# Patient Record
Sex: Male | Born: 1945 | Race: White | Hispanic: No | Smoking: Current every day smoker
Health system: Southern US, Community
[De-identification: ages and names within clinical notes are randomized; demographics above are authoritative.]

## PROBLEM LIST (undated history)

## (undated) DIAGNOSIS — C719 Malignant neoplasm of brain, unspecified: Secondary | ICD-10-CM

## (undated) DIAGNOSIS — I607 Nontraumatic subarachnoid hemorrhage from unspecified intracranial artery: Secondary | ICD-10-CM

## (undated) DIAGNOSIS — S065X9A Traumatic subdural hemorrhage with loss of consciousness of unspecified duration, initial encounter: Secondary | ICD-10-CM

## (undated) DIAGNOSIS — C7931 Secondary malignant neoplasm of brain: Secondary | ICD-10-CM

## (undated) DIAGNOSIS — M479 Spondylosis, unspecified: Secondary | ICD-10-CM

## (undated) DIAGNOSIS — E119 Type 2 diabetes mellitus without complications: Secondary | ICD-10-CM

## (undated) DIAGNOSIS — E7800 Pure hypercholesterolemia, unspecified: Secondary | ICD-10-CM

## (undated) DIAGNOSIS — T7840XA Allergy, unspecified, initial encounter: Secondary | ICD-10-CM

## (undated) DIAGNOSIS — Z9889 Other specified postprocedural states: Secondary | ICD-10-CM

## (undated) DIAGNOSIS — I671 Cerebral aneurysm, nonruptured: Secondary | ICD-10-CM

## (undated) DIAGNOSIS — J449 Chronic obstructive pulmonary disease, unspecified: Secondary | ICD-10-CM

## (undated) DIAGNOSIS — C3490 Malignant neoplasm of unspecified part of unspecified bronchus or lung: Secondary | ICD-10-CM

## (undated) DIAGNOSIS — M419 Scoliosis, unspecified: Secondary | ICD-10-CM

## (undated) DIAGNOSIS — I10 Essential (primary) hypertension: Secondary | ICD-10-CM

## (undated) DIAGNOSIS — S0300XA Dislocation of jaw, unspecified side, initial encounter: Secondary | ICD-10-CM

## (undated) DIAGNOSIS — S065XAA Traumatic subdural hemorrhage with loss of consciousness status unknown, initial encounter: Secondary | ICD-10-CM

## (undated) HISTORY — PX: HERNIA REPAIR: SHX51

## (undated) HISTORY — PX: MANDIBLE FRACTURE SURGERY: SHX706

## (undated) HISTORY — PX: HAND SURGERY: SHX662

## (undated) HISTORY — PX: APPENDECTOMY: SHX54

---

## 1998-04-28 HISTORY — PX: CRANIOTOMY: SHX93

## 1998-10-14 ENCOUNTER — Inpatient Hospital Stay (HOSPITAL_COMMUNITY): Admission: EM | Admit: 1998-10-14 | Discharge: 1998-10-18 | Payer: Self-pay | Admitting: Neurosurgery

## 1998-10-14 ENCOUNTER — Encounter: Payer: Self-pay | Admitting: Emergency Medicine

## 1998-10-23 ENCOUNTER — Ambulatory Visit (HOSPITAL_COMMUNITY): Admission: RE | Admit: 1998-10-23 | Discharge: 1998-10-23 | Payer: Self-pay | Admitting: Neurosurgery

## 2001-04-05 ENCOUNTER — Emergency Department (HOSPITAL_COMMUNITY): Admission: EM | Admit: 2001-04-05 | Discharge: 2001-04-05 | Payer: Self-pay | Admitting: Emergency Medicine

## 2001-04-05 ENCOUNTER — Encounter: Payer: Self-pay | Admitting: Emergency Medicine

## 2001-04-14 ENCOUNTER — Encounter: Admission: RE | Admit: 2001-04-14 | Discharge: 2001-04-14 | Payer: Self-pay | Admitting: Internal Medicine

## 2001-11-02 ENCOUNTER — Encounter: Admission: RE | Admit: 2001-11-02 | Discharge: 2001-11-02 | Payer: Self-pay | Admitting: Internal Medicine

## 2007-04-29 DIAGNOSIS — S0300XA Dislocation of jaw, unspecified side, initial encounter: Secondary | ICD-10-CM

## 2007-04-29 HISTORY — DX: Dislocation of jaw, unspecified side, initial encounter: S03.00XA

## 2007-06-14 ENCOUNTER — Emergency Department (HOSPITAL_COMMUNITY): Admission: EM | Admit: 2007-06-14 | Discharge: 2007-06-14 | Payer: Self-pay | Admitting: Emergency Medicine

## 2009-02-21 ENCOUNTER — Emergency Department (HOSPITAL_COMMUNITY): Admission: EM | Admit: 2009-02-21 | Discharge: 2009-02-21 | Payer: Self-pay | Admitting: Emergency Medicine

## 2010-04-28 DIAGNOSIS — I607 Nontraumatic subarachnoid hemorrhage from unspecified intracranial artery: Secondary | ICD-10-CM

## 2010-04-28 HISTORY — DX: Nontraumatic subarachnoid hemorrhage from unspecified intracranial artery: I60.7

## 2010-09-03 ENCOUNTER — Emergency Department (HOSPITAL_COMMUNITY)
Admission: EM | Admit: 2010-09-03 | Discharge: 2010-09-03 | Disposition: A | Discharge status: Nursing Home (Includes ICF) | Payer: Self-pay | Attending: Emergency Medicine | Admitting: Emergency Medicine

## 2010-09-03 ENCOUNTER — Emergency Department (HOSPITAL_COMMUNITY): Payer: Self-pay

## 2010-09-03 DIAGNOSIS — R61 Generalized hyperhidrosis: Secondary | ICD-10-CM | POA: Insufficient documentation

## 2010-09-03 DIAGNOSIS — H5500 Unspecified nystagmus: Secondary | ICD-10-CM | POA: Insufficient documentation

## 2010-09-03 DIAGNOSIS — R279 Unspecified lack of coordination: Secondary | ICD-10-CM | POA: Insufficient documentation

## 2010-09-03 DIAGNOSIS — R112 Nausea with vomiting, unspecified: Secondary | ICD-10-CM | POA: Insufficient documentation

## 2010-09-03 DIAGNOSIS — R51 Headache: Secondary | ICD-10-CM | POA: Insufficient documentation

## 2010-09-03 DIAGNOSIS — R42 Dizziness and giddiness: Secondary | ICD-10-CM | POA: Insufficient documentation

## 2010-09-03 DIAGNOSIS — I609 Nontraumatic subarachnoid hemorrhage, unspecified: Secondary | ICD-10-CM | POA: Insufficient documentation

## 2010-09-03 LAB — POCT I-STAT, CHEM 8
BUN: 14 mg/dL (ref 6–23)
Calcium, Ion: 1.08 mmol/L — ABNORMAL LOW (ref 1.12–1.32)
Chloride: 103 mEq/L (ref 96–112)
Creatinine, Ser: 1.1 mg/dL (ref 0.4–1.5)
Glucose, Bld: 153 mg/dL — ABNORMAL HIGH (ref 70–99)
HCT: 47 % (ref 39.0–52.0)
Hemoglobin: 16 g/dL (ref 13.0–17.0)
Potassium: 4.5 mEq/L (ref 3.5–5.1)
Sodium: 137 mEq/L (ref 135–145)
TCO2: 27 mmol/L (ref 0–100)

## 2010-09-03 LAB — DIFFERENTIAL
Basophils Absolute: 0 10*3/uL (ref 0.0–0.1)
Basophils Relative: 0 % (ref 0–1)
Eosinophils Absolute: 0 10*3/uL (ref 0.0–0.7)
Eosinophils Relative: 0 % (ref 0–5)
Lymphocytes Relative: 9 % — ABNORMAL LOW (ref 12–46)
Lymphs Abs: 1.1 10*3/uL (ref 0.7–4.0)
Monocytes Absolute: 0.5 10*3/uL (ref 0.1–1.0)
Monocytes Relative: 4 % (ref 3–12)
Neutro Abs: 10.4 10*3/uL — ABNORMAL HIGH (ref 1.7–7.7)
Neutrophils Relative %: 86 % — ABNORMAL HIGH (ref 43–77)

## 2010-09-03 LAB — CBC
HCT: 46 % (ref 39.0–52.0)
Hemoglobin: 15.8 g/dL (ref 13.0–17.0)
MCHC: 34.3 g/dL (ref 30.0–36.0)
MCV: 89.3 fL (ref 78.0–100.0)
RDW: 13.4 % (ref 11.5–15.5)

## 2010-09-03 LAB — APTT: aPTT: 25 seconds (ref 24–37)

## 2010-09-03 LAB — POCT CARDIAC MARKERS
CKMB, poc: 1.1 ng/mL (ref 1.0–8.0)
Myoglobin, poc: 88.3 ng/mL (ref 12–200)
Troponin i, poc: <0.05 ng/mL (ref 0.00–0.09)

## 2010-09-03 LAB — PROTIME-INR
INR: 0.94 (ref 0.00–1.49)
Prothrombin Time: 12.8 seconds (ref 11.6–15.2)

## 2010-10-14 ENCOUNTER — Emergency Department (HOSPITAL_BASED_OUTPATIENT_CLINIC_OR_DEPARTMENT_OTHER)
Admission: EM | Admit: 2010-10-14 | Discharge: 2010-10-14 | Disposition: A | Discharge status: Home / Self Care | Payer: Self-pay | Attending: Emergency Medicine | Admitting: Emergency Medicine

## 2010-10-14 DIAGNOSIS — F17200 Nicotine dependence, unspecified, uncomplicated: Secondary | ICD-10-CM | POA: Insufficient documentation

## 2010-10-14 DIAGNOSIS — I10 Essential (primary) hypertension: Secondary | ICD-10-CM | POA: Insufficient documentation

## 2013-02-03 ENCOUNTER — Encounter (HOSPITAL_BASED_OUTPATIENT_CLINIC_OR_DEPARTMENT_OTHER): Payer: Self-pay | Admitting: Emergency Medicine

## 2013-02-03 ENCOUNTER — Emergency Department (HOSPITAL_BASED_OUTPATIENT_CLINIC_OR_DEPARTMENT_OTHER)
Admission: EM | Admit: 2013-02-03 | Discharge: 2013-02-03 | Disposition: A | Discharge status: Home / Self Care | Payer: Non-veteran care | Attending: Emergency Medicine | Admitting: Emergency Medicine

## 2013-02-03 ENCOUNTER — Emergency Department (HOSPITAL_BASED_OUTPATIENT_CLINIC_OR_DEPARTMENT_OTHER): Payer: Non-veteran care

## 2013-02-03 DIAGNOSIS — R51 Headache: Secondary | ICD-10-CM | POA: Insufficient documentation

## 2013-02-03 DIAGNOSIS — S139XXA Sprain of joints and ligaments of unspecified parts of neck, initial encounter: Secondary | ICD-10-CM | POA: Insufficient documentation

## 2013-02-03 DIAGNOSIS — Y929 Unspecified place or not applicable: Secondary | ICD-10-CM | POA: Insufficient documentation

## 2013-02-03 DIAGNOSIS — R42 Dizziness and giddiness: Secondary | ICD-10-CM | POA: Insufficient documentation

## 2013-02-03 DIAGNOSIS — I10 Essential (primary) hypertension: Secondary | ICD-10-CM | POA: Insufficient documentation

## 2013-02-03 DIAGNOSIS — C3490 Malignant neoplasm of unspecified part of unspecified bronchus or lung: Secondary | ICD-10-CM

## 2013-02-03 DIAGNOSIS — X58XXXA Exposure to other specified factors, initial encounter: Secondary | ICD-10-CM | POA: Insufficient documentation

## 2013-02-03 DIAGNOSIS — Y939 Activity, unspecified: Secondary | ICD-10-CM | POA: Insufficient documentation

## 2013-02-03 DIAGNOSIS — S161XXA Strain of muscle, fascia and tendon at neck level, initial encounter: Secondary | ICD-10-CM

## 2013-02-03 DIAGNOSIS — Z8679 Personal history of other diseases of the circulatory system: Secondary | ICD-10-CM | POA: Insufficient documentation

## 2013-02-03 DIAGNOSIS — E7800 Pure hypercholesterolemia, unspecified: Secondary | ICD-10-CM | POA: Insufficient documentation

## 2013-02-03 DIAGNOSIS — Z79899 Other long term (current) drug therapy: Secondary | ICD-10-CM | POA: Insufficient documentation

## 2013-02-03 DIAGNOSIS — Z880 Allergy status to penicillin: Secondary | ICD-10-CM | POA: Insufficient documentation

## 2013-02-03 DIAGNOSIS — Z87828 Personal history of other (healed) physical injury and trauma: Secondary | ICD-10-CM | POA: Insufficient documentation

## 2013-02-03 HISTORY — DX: Essential (primary) hypertension: I10

## 2013-02-03 HISTORY — DX: Pure hypercholesterolemia, unspecified: E78.00

## 2013-02-03 HISTORY — DX: Cerebral aneurysm, nonruptured: I67.1

## 2013-02-03 HISTORY — DX: Malignant neoplasm of unspecified part of unspecified bronchus or lung: C34.90

## 2013-02-03 LAB — CBC WITH DIFFERENTIAL/PLATELET
Basophils Relative: 1 % (ref 0–1)
Eosinophils Absolute: 0.4 10*3/uL (ref 0.0–0.7)
Hemoglobin: 13.6 g/dL (ref 13.0–17.0)
Lymphs Abs: 2.6 10*3/uL (ref 0.7–4.0)
MCH: 30.5 pg (ref 26.0–34.0)
MCHC: 34.5 g/dL (ref 30.0–36.0)
Monocytes Relative: 12 % (ref 3–12)
Neutro Abs: 3.5 10*3/uL (ref 1.7–7.7)
Neutrophils Relative %: 48 % (ref 43–77)
Platelets: 196 10*3/uL (ref 150–400)
RBC: 4.46 MIL/uL (ref 4.22–5.81)

## 2013-02-03 LAB — BASIC METABOLIC PANEL
BUN: 19 mg/dL (ref 6–23)
Chloride: 99 mEq/L (ref 96–112)
GFR calc Af Amer: >90 mL/min (ref >90)
GFR calc non Af Amer: 87 mL/min — ABNORMAL LOW (ref >90)
Potassium: 3.9 mEq/L (ref 3.5–5.1)
Sodium: 136 mEq/L (ref 135–145)

## 2013-02-03 MED ORDER — HYDROCODONE-ACETAMINOPHEN 5-325 MG PO TABS: 1.0000 | ORAL_TABLET | Freq: Four times a day (QID) | ORAL | Status: DC | PRN

## 2013-02-03 MED ORDER — HYDROCODONE-ACETAMINOPHEN 5-325 MG PO TABS
2.0000 | ORAL_TABLET | Freq: Once | ORAL | Status: AC
  Administered 2013-02-03: 2 via ORAL
  Filled 2013-02-03: qty 2

## 2013-02-03 MED ORDER — IOHEXOL 350 MG/ML SOLN
100.0000 mL | Freq: Once | INTRAVENOUS | Status: AC | PRN
  Administered 2013-02-03: 100 mL via INTRAVENOUS

## 2013-02-03 MED ORDER — CYCLOBENZAPRINE HCL 10 MG PO TABS: 10.0000 mg | ORAL_TABLET | Freq: Two times a day (BID) | ORAL | Status: DC | PRN

## 2013-02-03 NOTE — ED Provider Notes (Signed)
CSN: [id number]     Arrival date & time 02/03/13  1406 History   First MD Initiated Contact with Patient 02/03/13 1500     Chief Complaint  Patient presents with  . Headache   (Consider location/radiation/quality/duration/timing/severity/associated sxs/prior Treatment) HPI Comments: 2 weeks of R sided neck pain at muscular insertion of occiput. Woke up w/ pain which has persisted.  Worse w/ neck flexion/rotation & palpation.  No alleviating symptoms. Pain constant.  No known injury. Described as a "crick in my neck".  Symptoms not similar to prior SAH due to cerebral aneurysm.   Patient is a 67 y.o. male presenting with headaches. The history is provided by the patient and a relative. No language interpreter was used.  Headache Pain location:  Occipital and frontal (about 1.5 weeks) Quality:  Sharp (like brain freeze) Radiates to:  Does not radiate Pain severity now: moderate. Pain scale at highest: moderate. Onset quality:  Sudden Duration: 1-10 mins. Timing:  Intermittent Progression:  Waxing and waning Chronicity:  New Similar to prior headaches: no   Relieved by:  Nothing Worsened by:  Nothing tried Ineffective treatments:  None tried Associated symptoms: dizziness (chronic, unchanged for 2 years), loss of balance (chronic, unchanged for 2 years since [facility]) and neck pain   Associated symptoms: no abdominal pain, no back pain, no blurred vision, no congestion, no cough, no diarrhea, no pain, no fatigue, no fever, no nausea, no numbness, no paresthesias, no photophobia, no URI, no vomiting and no weakness   Risk factors comment:  Hx SAH   Past Medical History  Diagnosis Date  . Hypertension   . High cholesterol   . Cerebral aneurysm    History reviewed. No pertinent past surgical history. No family history on file. History  Substance Use Topics  . Smoking status: Not on file  . Smokeless tobacco: Not on file  . Alcohol Use: No    Review of Systems  Constitutional:  Negative for fever, activity change, appetite change and fatigue.  HENT: Negative for congestion, facial swelling, rhinorrhea and trouble swallowing.   Eyes: Negative for blurred vision, photophobia and pain.  Respiratory: Negative for cough, chest tightness and shortness of breath.   Cardiovascular: Negative for chest pain and leg swelling.  Gastrointestinal: Negative for nausea, vomiting, abdominal pain, diarrhea and constipation.  Endocrine: Negative for polydipsia and polyuria.  Genitourinary: Negative for dysuria, urgency, decreased urine volume and difficulty urinating.  Musculoskeletal: Positive for neck pain. Negative for back pain and gait problem.  Skin: Negative for color change, rash and wound.  Allergic/Immunologic: Negative for immunocompromised state.  Neurological: Positive for dizziness (chronic, unchanged for 2 years), headaches and loss of balance (chronic, unchanged for 2 years since [facility]). Negative for facial asymmetry, speech difficulty, weakness, numbness and paresthesias.  Psychiatric/Behavioral: Negative for confusion, decreased concentration and agitation.    Allergies  Penicillins  Home Medications   Current Outpatient Rx  Name  Route  Sig  Dispense  Refill  . Cholecalciferol (VITAMIN D PO)   Oral   Take by mouth.         Marland Kitchen HYDROCHLOROTHIAZIDE PO   Oral   Take by mouth.         Marland Kitchen LISINOPRIL PO   Oral   Take by mouth.         . cyclobenzaprine (FLEXERIL) 10 MG tablet   Oral   Take 1 tablet (10 mg total) by mouth 2 (two) times daily as needed for muscle spasms.   15 tablet  0   . HYDROcodone-acetaminophen (NORCO) 5-325 MG per tablet   Oral   Take 1 tablet by mouth every 6 (six) hours as needed for pain.   10 tablet   0    BP 131/58  Pulse 60  Temp(Src) 98.3 F (36.8 C) (Oral)  Resp 18  SpO2 100% Physical Exam  Constitutional: He is oriented to person, place, and time. He appears well-developed and well-nourished. No distress.    HENT:  Head: Normocephalic and atraumatic.  Mouth/Throat: No oropharyngeal exudate.  Eyes: Pupils are equal, round, and reactive to light.  Neck: Normal range of motion. Neck supple. Muscular tenderness present. No spinous process tenderness present. No edema, no erythema and normal range of motion present.    Cardiovascular: Normal rate, regular rhythm and normal heart sounds.  Exam reveals no gallop and no friction rub.   No murmur heard. Pulmonary/Chest: Effort normal and breath sounds normal. No respiratory distress. He has no wheezes. He has no rales.  Abdominal: Soft. Bowel sounds are normal. He exhibits no distension and no mass. There is no tenderness. There is no rebound and no guarding.  Musculoskeletal: Normal range of motion. He exhibits no edema and no tenderness.  Neurological: He is alert and oriented to person, place, and time.  Skin: Skin is warm and dry.  Psychiatric: He has a normal mood and affect.    ED Course  Procedures (including critical care time) Labs Review Labs Reviewed  BASIC METABOLIC PANEL - Abnormal; Notable for the following:    Glucose, Bld 131 (*)    GFR calc non Af Amer 87 (*)    All other components within normal limits  CBC WITH DIFFERENTIAL   Imaging Review Ct Angio Head W/cm &/or Wo Cm  02/03/2013   CLINICAL DATA:  Headaches.  History of subarachnoid hemorrhage.  EXAM: CT ANGIOGRAPHY HEAD AND NECK  TECHNIQUE: Multidetector CT imaging of the head and neck was performed using the standard protocol during bolus administration of intravenous contrast. Multiplanar CT image reconstructions including MIPs were obtained to evaluate the vascular anatomy. Carotid stenosis measurements (when applicable) are obtained utilizing NASCET criteria, using the distal internal carotid diameter as the denominator.  CONTRAST:  OMNIPAQUE IOHEXOL 350 MG/ML SOLN  COMPARISON:  CT head 09/03/2010.  FINDINGS: CTA HEAD FINDINGS  Mild atrophy. Chronic microvascular  ischemic change. No acute stroke, hemorrhage, mass lesion, hydrocephalus, or extra-axial fluid. Prior left frontotemporal craniotomy for subdural. Chronic left zygoma deformity. No abnormal enhancement postcontrast. Small burr hole on the right, likely secondary to intracranial pressure monitor.  Non stenotic atheromatous change both carotid siphons. Normal basilar artery with vertebrals both contributing, left slightly larger.  No proximal stenosis of the anterior, middle, or posterior cerebral arteries. No cerebellar branch occlusion.  Within limits of detection on CTA, no visible intracranial aneurysm. No intracranial branch occlusion.  Review of the MIP images confirms the above findings.  CTA NECK FINDINGS  Transverse arch atheromatous change. Moderate plaque left subclavian origin is not flow reducing. Mild plaque origin of the right and left common carotid arteries.  Non stenotic calcific and soft plaque at the right carotid bifurcation. Less than 50% stenosis left carotid bifurcation with both calcific and soft plaque, measuring 3.2/4.7 proximal/ distal luminal diameters). Both vertebrals are widely patent without significant ostial stenosis.  COPD changes at the lung apices without pneumothorax. No neck masses. Airway midline. Cervical spondylosis.  Review of the MIP images confirms the above findings.  IMPRESSION: CTA HEAD IMPRESSION  Chronic  changes as described. Left craniotomy. No acute subarachnoid hemorrhage. No visible intracranial dissection or aneurysm.  CTA NECK IMPRESSION  Non stenotic atheromatous change both carotid bifurcations, left greater than right. No evidence for dissection or vascular occlusion. COPD.   Electronically Signed   By: Davonna Belling M.D.   On: 02/03/2013 17:25   Ct Angio Neck W/cm &/or Wo/cm  02/03/2013   CLINICAL DATA:  Headaches.  History of subarachnoid hemorrhage.  EXAM: CT ANGIOGRAPHY HEAD AND NECK  TECHNIQUE: Multidetector CT imaging of the head and neck was  performed using the standard protocol during bolus administration of intravenous contrast. Multiplanar CT image reconstructions including MIPs were obtained to evaluate the vascular anatomy. Carotid stenosis measurements (when applicable) are obtained utilizing NASCET criteria, using the distal internal carotid diameter as the denominator.  CONTRAST:  OMNIPAQUE IOHEXOL 350 MG/ML SOLN  COMPARISON:  CT head 09/03/2010.  FINDINGS: CTA HEAD FINDINGS  Mild atrophy. Chronic microvascular ischemic change. No acute stroke, hemorrhage, mass lesion, hydrocephalus, or extra-axial fluid. Prior left frontotemporal craniotomy for subdural. Chronic left zygoma deformity. No abnormal enhancement postcontrast. Small burr hole on the right, likely secondary to intracranial pressure monitor.  Non stenotic atheromatous change both carotid siphons. Normal basilar artery with vertebrals both contributing, left slightly larger.  No proximal stenosis of the anterior, middle, or posterior cerebral arteries. No cerebellar branch occlusion.  Within limits of detection on CTA, no visible intracranial aneurysm. No intracranial branch occlusion.  Review of the MIP images confirms the above findings.  CTA NECK FINDINGS  Transverse arch atheromatous change. Moderate plaque left subclavian origin is not flow reducing. Mild plaque origin of the right and left common carotid arteries.  Non stenotic calcific and soft plaque at the right carotid bifurcation. Less than 50% stenosis left carotid bifurcation with both calcific and soft plaque, measuring 3.2/4.7 proximal/ distal luminal diameters). Both vertebrals are widely patent without significant ostial stenosis.  COPD changes at the lung apices without pneumothorax. No neck masses. Airway midline. Cervical spondylosis.  Review of the MIP images confirms the above findings.  IMPRESSION: CTA HEAD IMPRESSION  Chronic changes as described. Left craniotomy. No acute subarachnoid hemorrhage. No  visible intracranial dissection or aneurysm.  CTA NECK IMPRESSION  Non stenotic atheromatous change both carotid bifurcations, left greater than right. No evidence for dissection or vascular occlusion. COPD.   Electronically Signed   By: Davonna Belling M.D.   On: 02/03/2013 17:25    EKG Interpretation   None       MDM   1. Neck strain, initial encounter   2. Headache    Pt is a 67 y.o. male with Pmhx as above including traumatic SDH and spontaneous cerebral aneurysm rupture about 2 years ago who presents with 2 weeks of R sided neck pain at muscular insertion at occiput, worse w/ mvmt & palpation, as well as about 1.5 weeks of intermittent assoc h/a lasting 1-10 mins happening 4-5 times daily, described as like a "brain freeze".  No other assoc symptoms, no new neuro symptoms. Pt in NAD, well-appearing on exam. Has slight ataxia which pt & relative say are baseline. +ttp paraspinal musculature as above. CTA head/neck are without acute changes.  I doubt CVA, TIA, dissection, SAH and feel this is more likely tension h/a or neck strain.  Pt felt improved after 2 norco's.  Will d/c home w/ flexeril, norco and recommend continued ibuprofen for pain.  Return precautions given for new or worsening symptoms including new neuro complaints, fever,  confusion. Pt will f/u with PCP at the [facility], MD 02/04/13 1624

## 2013-02-03 NOTE — ED Notes (Signed)
Patient asked to change into gown. 

## 2013-02-03 NOTE — ED Notes (Addendum)
Headache. Hx cerebral aneursym in the past and he has headaches off and on. His balance has been off.

## 2014-11-20 ENCOUNTER — Other Ambulatory Visit (HOSPITAL_COMMUNITY): Payer: Self-pay | Admitting: Internal Medicine

## 2014-11-20 DIAGNOSIS — C3490 Malignant neoplasm of unspecified part of unspecified bronchus or lung: Secondary | ICD-10-CM

## 2014-11-23 ENCOUNTER — Encounter: Payer: Self-pay | Admitting: Radiation Oncology

## 2014-11-23 NOTE — Progress Notes (Signed)
Location/Histology of Brain Tumor:  11/15/14 CT HEAD multiple (at least 15) enhancing nodules scattered throughout the B/L cerebral hemispheres and posterior fossa consistent with intracranial metastases  Patient presented with symptoms of:  Patient had pain in both sides under his ribs.  He has also lost 25 lbs since June.  He had dizziness and coughing.    Past or anticipated interventions, if any, per neurosurgery: HX  Post op changes from right frontal burr hole and left frontal craniotomy  Past or anticipated interventions, if any, per medical oncology: NO  Dose of Decadron, if applicable: takes 4 mg three times a day.  Recent neurologic symptoms, if any:   Seizures: had 1 in 2006  Headaches: Chronic for 2 years  Dizziness/ataxia: chronic 2 years ,loss balance, uses a cane  Difficulty with hand coordination: no  Focal numbness/weakness: off balance   Visual deficits/changes: reports that his eyes try to cross about twice a week when he wakes up at 2-3 in the morning  Confusion/Memory deficits: no  Painful bone metastases at present, if any: no.  PET scan scheduled for Monday.  SAFETY ISSUES:  Prior radiation? NO  Pacemaker/ICD? NO  Is the patient on meth otrexate? NO  Additional Complaints / other details: NON Small Cell Carcinoma,   from [facility], referral by Dr. Judithann Graves  ,lives alone, Daughter [name] ,[place] cerbral aneurysm 2012,  hx left  Frontal craniotomy COPD  Numerous hepatic lesions  Retrocrural left node suspicious for metastatic disease, B/L inguinal hernias Allergies: PCNS  Patient is here with his daughter and granddaughter.  BP 152/84 mmHg  Pulse 88  Temp(Src) 98.4 F (36.9 C) (Oral)  Resp 20  Ht 5' 11.5" (1.816 m)  Wt 175 lb 4.8 oz (79.516 kg)  BMI 24.11 kg/m2  SpO2 99%

## 2014-11-27 ENCOUNTER — Ambulatory Visit
Admission: RE | Admit: 2014-11-27 | Discharge: 2014-11-27 | Disposition: A | Discharge status: Home / Self Care | Payer: No Typology Code available for payment source | Source: Ambulatory Visit | Attending: Radiation Oncology | Admitting: Radiation Oncology

## 2014-11-27 ENCOUNTER — Encounter: Payer: Self-pay | Admitting: Radiation Oncology

## 2014-11-27 ENCOUNTER — Ambulatory Visit (HOSPITAL_COMMUNITY): Payer: Non-veteran care

## 2014-11-27 ENCOUNTER — Ambulatory Visit: Payer: No Typology Code available for payment source

## 2014-11-27 VITALS — BP 152/84 | HR 88 | Temp 98.4°F | Resp 20 | Ht 71.5 in | Wt 175.3 lb

## 2014-11-27 DIAGNOSIS — C7931 Secondary malignant neoplasm of brain: Secondary | ICD-10-CM | POA: Insufficient documentation

## 2014-11-27 DIAGNOSIS — I10 Essential (primary) hypertension: Secondary | ICD-10-CM | POA: Insufficient documentation

## 2014-11-27 DIAGNOSIS — R51 Headache: Secondary | ICD-10-CM | POA: Diagnosis not present

## 2014-11-27 DIAGNOSIS — J449 Chronic obstructive pulmonary disease, unspecified: Secondary | ICD-10-CM | POA: Diagnosis not present

## 2014-11-27 DIAGNOSIS — Z510 Encounter for antineoplastic radiation therapy: Secondary | ICD-10-CM | POA: Insufficient documentation

## 2014-11-27 DIAGNOSIS — F17200 Nicotine dependence, unspecified, uncomplicated: Secondary | ICD-10-CM | POA: Diagnosis not present

## 2014-11-27 DIAGNOSIS — Z880 Allergy status to penicillin: Secondary | ICD-10-CM | POA: Insufficient documentation

## 2014-11-27 DIAGNOSIS — R600 Localized edema: Secondary | ICD-10-CM | POA: Insufficient documentation

## 2014-11-27 DIAGNOSIS — E780 Pure hypercholesterolemia: Secondary | ICD-10-CM | POA: Insufficient documentation

## 2014-11-27 DIAGNOSIS — C3490 Malignant neoplasm of unspecified part of unspecified bronchus or lung: Secondary | ICD-10-CM | POA: Diagnosis not present

## 2014-11-27 DIAGNOSIS — Z7982 Long term (current) use of aspirin: Secondary | ICD-10-CM | POA: Diagnosis not present

## 2014-11-27 HISTORY — DX: Dislocation of jaw, unspecified side, initial encounter: S03.00XA

## 2014-11-27 HISTORY — DX: Spondylosis, unspecified: M47.9

## 2014-11-27 HISTORY — DX: Chronic obstructive pulmonary disease, unspecified: J44.9

## 2014-11-27 HISTORY — DX: Malignant neoplasm of brain, unspecified: C71.9

## 2014-11-27 HISTORY — DX: Traumatic subdural hemorrhage with loss of consciousness status unknown, initial encounter: S06.5XAA

## 2014-11-27 HISTORY — DX: Nontraumatic subarachnoid hemorrhage from unspecified intracranial artery: I60.7

## 2014-11-27 HISTORY — DX: Malignant neoplasm of unspecified part of unspecified bronchus or lung: C34.90

## 2014-11-27 HISTORY — DX: Allergy, unspecified, initial encounter: T78.40XA

## 2014-11-27 HISTORY — DX: Secondary malignant neoplasm of brain: C79.31

## 2014-11-27 HISTORY — DX: Traumatic subdural hemorrhage with loss of consciousness of unspecified duration, initial encounter: S06.5X9A

## 2014-11-27 HISTORY — DX: Scoliosis, unspecified: M41.9

## 2014-11-27 HISTORY — DX: Other specified postprocedural states: Z98.890
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
## 2014-11-28 ENCOUNTER — Encounter: Payer: Self-pay | Admitting: Radiation Oncology

## 2014-11-28 DIAGNOSIS — C7931 Secondary malignant neoplasm of brain: Secondary | ICD-10-CM

## 2014-11-28 HISTORY — DX: Secondary malignant neoplasm of brain: C79.31

## 2014-11-30 ENCOUNTER — Ambulatory Visit
Admission: RE | Admit: 2014-11-30 | Discharge: 2014-11-30 | Disposition: A | Discharge status: Home / Self Care | Payer: Non-veteran care | Source: Ambulatory Visit | Attending: Radiation Oncology | Admitting: Radiation Oncology

## 2014-11-30 ENCOUNTER — Ambulatory Visit
Admission: RE | Admit: 2014-11-30 | Discharge: 2014-11-30 | Disposition: A | Discharge status: Home / Self Care | Payer: No Typology Code available for payment source | Source: Ambulatory Visit | Attending: Radiation Oncology | Admitting: Radiation Oncology

## 2014-11-30 DIAGNOSIS — C7931 Secondary malignant neoplasm of brain: Secondary | ICD-10-CM

## 2014-11-30 DIAGNOSIS — Z510 Encounter for antineoplastic radiation therapy: Secondary | ICD-10-CM | POA: Diagnosis not present

## 2014-11-30 MED ORDER — BIAFINE EX EMUL
Freq: Every day | CUTANEOUS | Status: DC
  Administered 2014-11-30: 10:00:00 via TOPICAL

## 2014-11-30 NOTE — Progress Notes (Signed)
Patient education, Brain,  Gave my business card, Radiation therapy and you book, discussed ways to manage side effects,symptons, skin irritation, pain, head aches, nausea, vomiting, hair loss, hearing difficulty possibly, eye irritation possibly, fatigue, Biafine cream also given, to apply to affected area daily after skin irritation or itchiness, daily after rad txs , verbal understanding, teach back

## 2014-12-01 ENCOUNTER — Ambulatory Visit
Admission: RE | Admit: 2014-12-01 | Discharge: 2014-12-01 | Disposition: A | Discharge status: Home / Self Care | Payer: No Typology Code available for payment source | Source: Ambulatory Visit | Attending: Radiation Oncology | Admitting: Radiation Oncology

## 2014-12-01 ENCOUNTER — Encounter: Payer: Self-pay | Admitting: Radiation Oncology

## 2014-12-01 VITALS — BP 141/61 | HR 77 | Resp 16 | Wt 181.0 lb

## 2014-12-01 DIAGNOSIS — C7931 Secondary malignant neoplasm of brain: Secondary | ICD-10-CM

## 2014-12-01 DIAGNOSIS — Z510 Encounter for antineoplastic radiation therapy: Secondary | ICD-10-CM | POA: Diagnosis not present

## 2014-12-01 NOTE — Progress Notes (Signed)
Weight and vitals stable. Denies pain. Denies headache, dizziness, nausea, or vomiting. Reports taking decadron 4 mg tid. Bilateral lower extremity edema noted. Questions if decadron can be reduced to avoid going on Lasix.  BP 141/61 mmHg  Pulse 77  Resp 16  Wt 181 lb (82.101 kg) Wt Readings from Last 3 Encounters:  12/01/14 181 lb (82.101 kg)  11/27/14 175 lb 4.8 oz (79.516 kg)

## 2014-12-01 NOTE — Progress Notes (Signed)
Department of Radiation Oncology  Phone:  [phone] Fax:        [phone]  Weekly Treatment Note    Name: Frank Krueger Date: 12/01/2014 MRN: [id number] DOB: 1945-10-27   Current dose: 2 Gy  Current fraction:6   MEDICATIONS: Current Outpatient Prescriptions  Medication Sig Dispense Refill  . aspirin 81 MG tablet Take 81 mg by mouth daily.    Marland Kitchen atorvastatin (LIPITOR) 80 MG tablet Take 80 mg by mouth daily. Take 1/2 tablet at bedtime    . calcium-vitamin D 250-100 MG-UNIT per tablet Take 1 tablet by mouth 2 (two) times daily.    . carboxymethylcellulose (REFRESH PLUS) 0.5 % SOLN 1 drop 4 (four) times daily. BOTH EYES  1 DROP    . clindamycin (CLEOCIN T) 1 % lotion Apply topically 2 (two) times daily as needed. APPLY LOTION TO AFFECTED AREA PRN FOR NOSE RASH    . clopidogrel (PLAVIX) 75 MG tablet Take 75 mg by mouth daily.    . cyclobenzaprine (FLEXERIL) 10 MG tablet Take 1 tablet (10 mg total) by mouth 2 (two) times daily as needed for muscle spasms. 15 tablet 0  . dexamethasone (DECADRON) 4 MG tablet Take 4 mg by mouth 3 (three) times daily.    Marland Kitchen emollient (BIAFINE) cream Apply 1 application topically as needed. Apply to area affected daily after rad tx    . ferrous sulfate 325 (65 FE) MG tablet Take 325 mg by mouth daily with breakfast.    . gabapentin (NEURONTIN) 300 MG capsule Take 300 mg by mouth 3 (three) times daily. Take 1 capsule('300mg'$ ) by mouth twice a day and take  2 capsules('600mg'$ ) at bedtime    . glucose blood test strip 1 each by Other route as directed. Use as instructed    . HYDROCHLOROTHIAZIDE PO Take by mouth.    Marland Kitchen HYDROcodone-acetaminophen (NORCO) 5-325 MG per tablet Take 1 tablet by mouth every 6 (six) hours as needed for pain. 10 tablet 0  . LISINOPRIL PO Take by mouth.    . METFORMIN HCL PO Take 500 mg by mouth daily. Metformin HCL  500 mg 24hour SA tab ,take 1 tablet by mouth daily    . salsalate (DISALCID) 750 MG tablet Take 750 mg by mouth 2 (two)  times daily as needed. Take two tablets by mouth twice a day as needed for pain     No current facility-administered medications for this encounter.     ALLERGIES: Penicillins   LABORATORY DATA:  Lab Results  Component Value Date   WBC 7.4 02/03/2013   HGB 13.6 02/03/2013   HCT 39.4 02/03/2013   MCV 88.3 02/03/2013   PLT 196 02/03/2013   Lab Results  Component Value Date   NA 136 02/03/2013   K 3.9 02/03/2013   CL 99 02/03/2013   CO2 27 02/03/2013   No results found for: ALT, AST, GGT, ALKPHOS, BILITOT   NARRATIVE: Frank Krueger was seen today for weekly treatment management. The chart was checked and the patient's films were reviewed.  Weight and vitals stable. Denies pain. Denies headache, dizziness, nausea, or vomiting. Reports taking decadron 4 mg tid. Bilateral lower extremity edema noted. Questions if decadron can be reduced to avoid going on Lasix.  PHYSICAL EXAMINATION: weight is 181 lb (82.101 kg). His blood pressure is 141/61 and his pulse is 77. His respiration is 16.      Patient has bilateral lower extremity swelling.   No thrush present.  ASSESSMENT: The patient  is doing satisfactorily with treatment.  PLAN: We will continue with the patient's radiation treatment as planned.  The patient's bilateral lower extremity swelling is being managed by his physicians at the [facility].  ------------------------------------------------  Jodelle Gross, MD, PhD  This document serves as a record of services personally performed by Kyung Rudd, MD. It was created on his behalf by Derek Mound, a trained medical scribe. The creation of this record is based on the scribe's personal observations and the provider's statements to them. This document has been checked and approved by the attending provider.

## 2014-12-04 ENCOUNTER — Ambulatory Visit (HOSPITAL_COMMUNITY)
Admission: RE | Admit: 2014-12-04 | Discharge: 2014-12-04 | Disposition: A | Discharge status: Home / Self Care | Payer: No Typology Code available for payment source | Source: Ambulatory Visit | Attending: Internal Medicine | Admitting: Internal Medicine

## 2014-12-04 ENCOUNTER — Ambulatory Visit: Payer: Non-veteran care | Admitting: Radiation Oncology

## 2014-12-04 ENCOUNTER — Ambulatory Visit
Admission: RE | Admit: 2014-12-04 | Discharge: 2014-12-04 | Disposition: A | Discharge status: Home / Self Care | Payer: No Typology Code available for payment source | Source: Ambulatory Visit | Attending: Radiation Oncology | Admitting: Radiation Oncology

## 2014-12-04 ENCOUNTER — Ambulatory Visit: Payer: Non-veteran care

## 2014-12-04 DIAGNOSIS — Z720 Tobacco use: Secondary | ICD-10-CM | POA: Diagnosis not present

## 2014-12-04 DIAGNOSIS — C3490 Malignant neoplasm of unspecified part of unspecified bronchus or lung: Secondary | ICD-10-CM | POA: Diagnosis present

## 2014-12-04 DIAGNOSIS — Z510 Encounter for antineoplastic radiation therapy: Secondary | ICD-10-CM | POA: Diagnosis not present

## 2014-12-04 LAB — GLUCOSE, CAPILLARY: Glucose-Capillary: 160 mg/dL — ABNORMAL HIGH (ref 65–99)

## 2014-12-04 MED ORDER — FLUDEOXYGLUCOSE F - 18 (FDG) INJECTION
813.0000 | Freq: Once | INTRAVENOUS | Status: AC | PRN
  Administered 2014-12-04: 813 via INTRAVENOUS

## 2014-12-05 ENCOUNTER — Inpatient Hospital Stay: Admission: RE | Admit: 2014-12-05 | Payer: Self-pay | Source: Ambulatory Visit | Admitting: Radiation Oncology

## 2014-12-05 ENCOUNTER — Ambulatory Visit
Admission: RE | Admit: 2014-12-05 | Discharge: 2014-12-05 | Disposition: A | Discharge status: Home / Self Care | Payer: No Typology Code available for payment source | Source: Ambulatory Visit | Attending: Radiation Oncology | Admitting: Radiation Oncology

## 2014-12-05 DIAGNOSIS — Z510 Encounter for antineoplastic radiation therapy: Secondary | ICD-10-CM | POA: Diagnosis not present

## 2014-12-05 DIAGNOSIS — C7931 Secondary malignant neoplasm of brain: Secondary | ICD-10-CM

## 2014-12-05 MED ORDER — DEXAMETHASONE 4 MG PO TABS
4.0000 mg | ORAL_TABLET | Freq: Once | ORAL | Status: AC
  Administered 2014-12-05: 4 mg via ORAL
  Filled 2014-12-05: qty 1

## 2014-12-05 NOTE — Progress Notes (Signed)
Discussed with Frank Krueger at [place] in [place]. Discussed need for labs and discussion of LE edema with PCP/Oncology.  Discussed bleeding event last night. Gave her our phone umber, fax number and requested labs be sent.

## 2014-12-05 NOTE — Progress Notes (Signed)
Weekly Management Note Current Dose:12 Gy  Projected Dose:30 Gy   Narrative:  The patient presents for routine under treatment assessment.  CBCT/MVCT images/Port film x-rays were reviewed.  The chart was checked. Nosebleed last night. LE edema continues. Headaches 2-3 times a day. Did not take decadron due to npo for portacath today. Daughter present. No recent labs  Physical Findings:  Alert. 2+ pitting edema.  BP - non orthostatic Sitting b/p=140/57,P=78,RR=20,T=98.3, room air sats=99% standing b/p=133/56,P=86   Weight:  Wt Readings from Last 3 Encounters:  12/01/14 181 lb (82.101 kg)  11/27/14 175 lb 4.8 oz (79.516 kg)   Lab Results  Component Value Date   WBC 7.4 02/03/2013   HGB 13.6 02/03/2013   HCT 39.4 02/03/2013   MCV 88.3 02/03/2013   PLT 196 02/03/2013   Lab Results  Component Value Date   CREATININE 0.90 02/03/2013   BUN 19 02/03/2013   NA 136 02/03/2013   K 3.9 02/03/2013   CL 99 02/03/2013   CO2 27 02/03/2013     Impression:  The patient is tolerating radiation.  Plan:  Continue treatment as planned. Gave decadron 4 mg po x 1 now. [facility] Las[facility] for LE edema.  I don't have his current med list and may not be approved for labs. He should have CBC and BMP drawn. Discussed palliative nature of treatments. Daughter is aware but patient seems in denial of dx.

## 2014-12-05 NOTE — Progress Notes (Signed)
Weekly rd tyx, whole brian, c/o both legs  Edematous and weak,unsteady,unable to get weighed today no DVT in leg U?S from Friday, has been NOP this am for port placement in [place] today at 1100, had nose bleed last night for about 1 hour minimum drainage,  called on call nurse and suggested to see MD today, took ortho vitals, very weak 10:00 AM  Sitting b/p=140/57,P=78,RR=20,T=98.3, room air sats=99% standing b/p=133/56,P=86

## 2014-12-05 NOTE — Progress Notes (Addendum)
$'4mg'J$  oral dexamethasone x 1 given to patient  With a sip of water per Dr. Pablo Ledger, daughter assiting patien tvia w/c to car and then for [facility] in [place] for port placement   10:38 AM

## 2014-12-05 NOTE — Addendum Note (Signed)
Encounter addended by: Doreen Beam, RN on: 12/05/2014 10:39 AM<BR>     Documentation filed: Notes Section, Inpatient MAR

## 2014-12-06 ENCOUNTER — Ambulatory Visit
Admission: RE | Admit: 2014-12-06 | Discharge: 2014-12-06 | Disposition: A | Discharge status: Home / Self Care | Payer: No Typology Code available for payment source | Source: Ambulatory Visit | Attending: Radiation Oncology | Admitting: Radiation Oncology

## 2014-12-06 DIAGNOSIS — Z510 Encounter for antineoplastic radiation therapy: Secondary | ICD-10-CM | POA: Diagnosis not present

## 2014-12-07 ENCOUNTER — Encounter: Payer: Self-pay | Admitting: Radiation Oncology

## 2014-12-07 ENCOUNTER — Ambulatory Visit
Admission: RE | Admit: 2014-12-07 | Discharge: 2014-12-07 | Disposition: A | Discharge status: Home / Self Care | Payer: Non-veteran care | Source: Ambulatory Visit | Attending: Radiation Oncology | Admitting: Radiation Oncology

## 2014-12-07 ENCOUNTER — Ambulatory Visit
Admission: RE | Admit: 2014-12-07 | Discharge: 2014-12-07 | Disposition: A | Discharge status: Home / Self Care | Payer: No Typology Code available for payment source | Source: Ambulatory Visit | Attending: Radiation Oncology | Admitting: Radiation Oncology

## 2014-12-07 ENCOUNTER — Telehealth: Payer: Self-pay | Admitting: *Deleted

## 2014-12-07 VITALS — Wt 177.6 lb

## 2014-12-07 DIAGNOSIS — C7970 Secondary malignant neoplasm of unspecified adrenal gland: Secondary | ICD-10-CM

## 2014-12-07 DIAGNOSIS — C787 Secondary malignant neoplasm of liver and intrahepatic bile duct: Secondary | ICD-10-CM | POA: Insufficient documentation

## 2014-12-07 DIAGNOSIS — C3431 Malignant neoplasm of lower lobe, right bronchus or lung: Secondary | ICD-10-CM | POA: Insufficient documentation

## 2014-12-07 DIAGNOSIS — C7931 Secondary malignant neoplasm of brain: Secondary | ICD-10-CM

## 2014-12-07 DIAGNOSIS — C7951 Secondary malignant neoplasm of bone: Secondary | ICD-10-CM | POA: Insufficient documentation

## 2014-12-07 NOTE — Progress Notes (Signed)
  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 Liver metastases 197.7 C78.7     C80.1   3. 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 Bone metastasis 198.5 C79.51   5. Primary cancer of right lower lobe of lung 162.5 C34.31     Current Dose: 18 Gy  Planned Dose:  30 Gy  Narrative The patient presents for routine under treatment assessment. Patient came to nursing and asked for results of his PET scan. Will be getting port-a-cath tomorrow at the [place], he did go on 12/05/14, but had a fever and it was cancelled per daughter. Pt reports general pain  Set-up films were reviewed. The chart was checked.  Physical Findings  weight is 177 lb 9.6 oz (80.559 kg). . Weight essentially stable.  No significant changes. Lower extremity edema with the left greater than the right, recent doppler negative for DVT.  Impression The patient is tolerating radiation.  Plan Continue treatment as planned. The pt is scheduled for a port-a-cath placement at the [facility] in [facility] tomorrow. We will provide a CD of the PET scan for the pt and his daughter to give to the [facility] [place].    This document serves as a record of services personally performed by Tyler Pita, MD. It was created on his behalf by Darcus Austin, a trained medical scribe. The creation of this record is based on the scribe's personal observations and the provider's statements to them. This document has been checked and approved by the attending provider.      Sheral Apley Tammi Klippel, M.D.

## 2014-12-07 NOTE — Progress Notes (Addendum)
Patient came to nursing and asked for results of pet scan, will be getting porta cath tomorrow at the [place], he did go on the 9th but had a fever and it was cancelled per daughter Dr. Pablo Ledger had seen patient 12/05/14 There were no vitals taken for this visit.

## 2014-12-07 NOTE — Telephone Encounter (Signed)
Called Radiology  Per MD ,[place] with [facility],  And asked  To have copies of cd's of patient's most recent Pet, CT head and CT Chest  For patient to pick up  Today,patient going to the [place] tomorrow and needs these , patient and family went to radiology to pick them up, thanked Ronalee Belts for getting these made so soon 5:01 PM

## 2014-12-08 ENCOUNTER — Encounter: Payer: Self-pay | Admitting: *Deleted

## 2014-12-08 ENCOUNTER — Ambulatory Visit: Payer: Non-veteran care | Admitting: Radiation Oncology

## 2014-12-08 ENCOUNTER — Ambulatory Visit
Admission: RE | Admit: 2014-12-08 | Discharge: 2014-12-08 | Disposition: A | Discharge status: Home / Self Care | Payer: No Typology Code available for payment source | Source: Ambulatory Visit | Attending: Radiation Oncology | Admitting: Radiation Oncology

## 2014-12-08 DIAGNOSIS — Z510 Encounter for antineoplastic radiation therapy: Secondary | ICD-10-CM | POA: Diagnosis not present

## 2014-12-08 NOTE — Progress Notes (Signed)
[place] Psychosocial Distress Screening Clinical Social Work  Clinical Social Work was referred by distress screening protocol.  The patient scored a 10 on the Psychosocial Distress Thermometer which indicates severe distress. Clinical Social Worker phoned pt to assess for distress and other psychosocial needs. CSW left message for pt and plans to follow up at radiation treatment next week. Pt could benefit from Brain Support Group and left info regarding this.   ONCBCN DISTRESS SCREENING 11/27/2014  Screening Type Initial Screening  Distress experienced in past week (1-10) 10  Family Problem type Children  Emotional problem type Depression;Nervousness/Anxiety;Adjusting to illness;Boredom;Adjusting to appearance changes  Information Concerns Type Lack of info about diagnosis;Lack of info about treatment;Lack of info about complementary therapy choices;Lack of info about maintaining fitness  Physical Problem type Pain;Sleep/insomnia;Getting around;Bathing/dressing;Mouth sores/swallowing;Constipation/diarrhea;Tingling hands/feet;Swollen arms/legs    Clinical Social Worker follow up needed: Yes.    If yes, follow up plan: Loren Racer, [place]  [facility] Phone: [phone] Fax: [phone]

## 2014-12-08 NOTE — Addendum Note (Signed)
Encounter addended by: Doreen Beam, RN on: 12/08/2014 12:12 PM<BR>     Documentation filed: Charges VN

## 2014-12-11 ENCOUNTER — Ambulatory Visit
Admission: RE | Admit: 2014-12-11 | Discharge: 2014-12-11 | Disposition: A | Discharge status: Home / Self Care | Payer: No Typology Code available for payment source | Source: Ambulatory Visit | Attending: Radiation Oncology | Admitting: Radiation Oncology

## 2014-12-11 ENCOUNTER — Ambulatory Visit
Admission: RE | Admit: 2014-12-11 | Discharge: 2014-12-11 | Disposition: A | Discharge status: Home / Self Care | Payer: Non-veteran care | Source: Ambulatory Visit | Attending: Radiation Oncology | Admitting: Radiation Oncology

## 2014-12-11 DIAGNOSIS — Z510 Encounter for antineoplastic radiation therapy: Secondary | ICD-10-CM | POA: Diagnosis not present

## 2014-12-11 DIAGNOSIS — C7931 Secondary malignant neoplasm of brain: Secondary | ICD-10-CM

## 2014-12-11 NOTE — Progress Notes (Signed)
Department of Radiation Oncology  Phone:  [phone] Fax:        [phone]  Weekly Treatment Note    Name: Frank Krueger Date: 12/11/2014 MRN: [id number] DOB: 1945/05/20   Current dose: 24 Gy  Current fraction: 8   MEDICATIONS: Current Outpatient Prescriptions  Medication Sig Dispense Refill  . aspirin 81 MG tablet Take 81 mg by mouth daily.    Marland Kitchen atorvastatin (LIPITOR) 80 MG tablet Take 80 mg by mouth daily. Take 1/2 tablet at bedtime    . calcium-vitamin D 250-100 MG-UNIT per tablet Take 1 tablet by mouth 2 (two) times daily.    . carboxymethylcellulose (REFRESH PLUS) 0.5 % SOLN 1 drop 4 (four) times daily. BOTH EYES  1 DROP    . clindamycin (CLEOCIN T) 1 % lotion Apply topically 2 (two) times daily as needed. APPLY LOTION TO AFFECTED AREA PRN FOR NOSE RASH    . clopidogrel (PLAVIX) 75 MG tablet Take 75 mg by mouth daily.    . cyclobenzaprine (FLEXERIL) 10 MG tablet Take 1 tablet (10 mg total) by mouth 2 (two) times daily as needed for muscle spasms. 15 tablet 0  . dexamethasone (DECADRON) 4 MG tablet Take 4 mg by mouth 3 (three) times daily.    Marland Kitchen emollient (BIAFINE) cream Apply 1 application topically as needed. Apply to area affected daily after rad tx    . ferrous sulfate 325 (65 FE) MG tablet Take 325 mg by mouth daily with breakfast.    . furosemide (LASIX) 20 MG tablet Take 20 mg by mouth. 10 mg daily    . gabapentin (NEURONTIN) 300 MG capsule Take 300 mg by mouth 3 (three) times daily. Take 1 capsule('300mg'$ ) by mouth twice a day and take  2 capsules('600mg'$ ) at bedtime    . glucose blood test strip 1 each by Other route as directed. Use as instructed    . HYDROCHLOROTHIAZIDE PO Take by mouth.    Marland Kitchen HYDROcodone-acetaminophen (NORCO) 5-325 MG per tablet Take 1 tablet by mouth every 6 (six) hours as needed for pain. (Patient not taking: Reported on 12/11/2014) 10 tablet 0  . LISINOPRIL PO Take by mouth.    . METFORMIN HCL PO Take 500 mg by mouth daily. Metformin HCL  500  mg 24hour SA tab ,take 1 tablet by mouth daily    . salsalate (DISALCID) 750 MG tablet Take 750 mg by mouth 2 (two) times daily as needed. Take two tablets by mouth twice a day as needed for pain     No current facility-administered medications for this encounter.     ALLERGIES: Penicillins   LABORATORY DATA:  Lab Results  Component Value Date   WBC 7.4 02/03/2013   HGB 13.6 02/03/2013   HCT 39.4 02/03/2013   MCV 88.3 02/03/2013   PLT 196 02/03/2013   Lab Results  Component Value Date   NA 136 02/03/2013   K 3.9 02/03/2013   CL 99 02/03/2013   CO2 27 02/03/2013   No results found for: ALT, AST, GGT, ALKPHOS, BILITOT   NARRATIVE: Frank Krueger was seen today for weekly treatment management. The chart was checked and the patient's films were reviewed.  The patient is seen today complaining of some swelling in the region of the left eye. He also has had some redness in this region.  PHYSICAL EXAMINATION: vitals were not taken for this visit.     mild swelling of the left eyelid and surrounding region. No redness of the sclera  itself  ASSESSMENT: The patient is doing satisfactorily with treatment.  PLAN: We will continue with the patient's radiation treatment as planned. The patient will begin using a warm compress several times a day. We will reevaluate this on Wednesday.

## 2014-12-11 NOTE — Progress Notes (Signed)
Patient here for assessment of left eye swelling, redness and sensitivity to light.

## 2014-12-12 ENCOUNTER — Ambulatory Visit
Admission: RE | Admit: 2014-12-12 | Discharge: 2014-12-12 | Disposition: A | Discharge status: Home / Self Care | Payer: No Typology Code available for payment source | Source: Ambulatory Visit | Attending: Radiation Oncology | Admitting: Radiation Oncology

## 2014-12-12 DIAGNOSIS — Z510 Encounter for antineoplastic radiation therapy: Secondary | ICD-10-CM | POA: Diagnosis not present

## 2014-12-13 ENCOUNTER — Ambulatory Visit
Admission: RE | Admit: 2014-12-13 | Discharge: 2014-12-13 | Disposition: A | Discharge status: Home / Self Care | Payer: No Typology Code available for payment source | Source: Ambulatory Visit | Attending: Radiation Oncology | Admitting: Radiation Oncology

## 2014-12-13 ENCOUNTER — Encounter: Payer: Self-pay | Admitting: Radiation Oncology

## 2014-12-13 DIAGNOSIS — Z510 Encounter for antineoplastic radiation therapy: Secondary | ICD-10-CM | POA: Diagnosis not present

## 2014-12-13 NOTE — Progress Notes (Addendum)
Patient presented to the clinic following final treatment. Patient has been seen in this block. Provided patient with one month follow up appointment card. Encouraged patient's daughter to contact this office with future needs. Educated patient's daughter, [name], on how to monitor patient for thrush. Patient has decadron taper already. Daughter needs a list of all the patient's appointments on letterhead to send to [place] for travel reimbursement. [name], daughter, can be reached at [phone] with questions. Informed Thayer Headings, RN for Dr. Lisbeth Renshaw of these findings.

## 2014-12-14 ENCOUNTER — Encounter: Payer: Self-pay | Admitting: Radiation Oncology

## 2014-12-15 ENCOUNTER — Encounter (HOSPITAL_COMMUNITY): Payer: Self-pay | Admitting: Neurology

## 2014-12-15 ENCOUNTER — Encounter: Payer: Self-pay | Admitting: Radiation Oncology

## 2014-12-15 ENCOUNTER — Inpatient Hospital Stay (HOSPITAL_COMMUNITY)
Admission: EM | Admit: 2014-12-15 | Discharge: 2014-12-20 | DRG: 054 | Disposition: A | Discharge status: Skilled Nursing Facility | Payer: Non-veteran care | Attending: Internal Medicine | Admitting: Internal Medicine

## 2014-12-15 ENCOUNTER — Inpatient Hospital Stay (HOSPITAL_COMMUNITY): Payer: Non-veteran care

## 2014-12-15 ENCOUNTER — Emergency Department (HOSPITAL_COMMUNITY): Payer: Non-veteran care

## 2014-12-15 DIAGNOSIS — E1165 Type 2 diabetes mellitus with hyperglycemia: Secondary | ICD-10-CM | POA: Diagnosis present

## 2014-12-15 DIAGNOSIS — R569 Unspecified convulsions: Secondary | ICD-10-CM | POA: Diagnosis not present

## 2014-12-15 DIAGNOSIS — I10 Essential (primary) hypertension: Secondary | ICD-10-CM | POA: Diagnosis present

## 2014-12-15 DIAGNOSIS — C7951 Secondary malignant neoplasm of bone: Secondary | ICD-10-CM | POA: Diagnosis present

## 2014-12-15 DIAGNOSIS — D72829 Elevated white blood cell count, unspecified: Secondary | ICD-10-CM | POA: Diagnosis present

## 2014-12-15 DIAGNOSIS — R4701 Aphasia: Secondary | ICD-10-CM | POA: Diagnosis present

## 2014-12-15 DIAGNOSIS — G8100 Flaccid hemiplegia affecting unspecified side: Secondary | ICD-10-CM | POA: Diagnosis not present

## 2014-12-15 DIAGNOSIS — Z8679 Personal history of other diseases of the circulatory system: Secondary | ICD-10-CM | POA: Diagnosis not present

## 2014-12-15 DIAGNOSIS — G40301 Generalized idiopathic epilepsy and epileptic syndromes, not intractable, with status epilepticus: Secondary | ICD-10-CM | POA: Diagnosis present

## 2014-12-15 DIAGNOSIS — Z880 Allergy status to penicillin: Secondary | ICD-10-CM | POA: Diagnosis not present

## 2014-12-15 DIAGNOSIS — C7931 Secondary malignant neoplasm of brain: Principal | ICD-10-CM

## 2014-12-15 DIAGNOSIS — Z79899 Other long term (current) drug therapy: Secondary | ICD-10-CM

## 2014-12-15 DIAGNOSIS — R739 Hyperglycemia, unspecified: Secondary | ICD-10-CM | POA: Diagnosis present

## 2014-12-15 DIAGNOSIS — Z79891 Long term (current) use of opiate analgesic: Secondary | ICD-10-CM | POA: Diagnosis not present

## 2014-12-15 DIAGNOSIS — G936 Cerebral edema: Secondary | ICD-10-CM | POA: Diagnosis present

## 2014-12-15 DIAGNOSIS — E780 Pure hypercholesterolemia: Secondary | ICD-10-CM | POA: Diagnosis present

## 2014-12-15 DIAGNOSIS — F17210 Nicotine dependence, cigarettes, uncomplicated: Secondary | ICD-10-CM | POA: Diagnosis present

## 2014-12-15 DIAGNOSIS — Z7982 Long term (current) use of aspirin: Secondary | ICD-10-CM | POA: Diagnosis not present

## 2014-12-15 DIAGNOSIS — M7989 Other specified soft tissue disorders: Secondary | ICD-10-CM | POA: Diagnosis present

## 2014-12-15 DIAGNOSIS — Z923 Personal history of irradiation: Secondary | ICD-10-CM | POA: Diagnosis not present

## 2014-12-15 DIAGNOSIS — E222 Syndrome of inappropriate secretion of antidiuretic hormone: Secondary | ICD-10-CM | POA: Diagnosis present

## 2014-12-15 DIAGNOSIS — D638 Anemia in other chronic diseases classified elsewhere: Secondary | ICD-10-CM | POA: Diagnosis present

## 2014-12-15 DIAGNOSIS — Z7902 Long term (current) use of antithrombotics/antiplatelets: Secondary | ICD-10-CM

## 2014-12-15 DIAGNOSIS — Z87820 Personal history of traumatic brain injury: Secondary | ICD-10-CM

## 2014-12-15 DIAGNOSIS — J9601 Acute respiratory failure with hypoxia: Secondary | ICD-10-CM | POA: Diagnosis not present

## 2014-12-15 DIAGNOSIS — D649 Anemia, unspecified: Secondary | ICD-10-CM | POA: Diagnosis present

## 2014-12-15 DIAGNOSIS — E871 Hypo-osmolality and hyponatremia: Secondary | ICD-10-CM

## 2014-12-15 DIAGNOSIS — G8384 Todd's paralysis (postepileptic): Secondary | ICD-10-CM | POA: Diagnosis present

## 2014-12-15 DIAGNOSIS — R1310 Dysphagia, unspecified: Secondary | ICD-10-CM | POA: Diagnosis present

## 2014-12-15 DIAGNOSIS — T380X5A Adverse effect of glucocorticoids and synthetic analogues, initial encounter: Secondary | ICD-10-CM | POA: Diagnosis present

## 2014-12-15 DIAGNOSIS — C787 Secondary malignant neoplasm of liver and intrahepatic bile duct: Secondary | ICD-10-CM | POA: Diagnosis present

## 2014-12-15 DIAGNOSIS — Z801 Family history of malignant neoplasm of trachea, bronchus and lung: Secondary | ICD-10-CM

## 2014-12-15 DIAGNOSIS — R609 Edema, unspecified: Secondary | ICD-10-CM | POA: Diagnosis not present

## 2014-12-15 DIAGNOSIS — G40901 Epilepsy, unspecified, not intractable, with status epilepticus: Secondary | ICD-10-CM

## 2014-12-15 DIAGNOSIS — C3431 Malignant neoplasm of lower lobe, right bronchus or lung: Secondary | ICD-10-CM | POA: Diagnosis present

## 2014-12-15 DIAGNOSIS — J449 Chronic obstructive pulmonary disease, unspecified: Secondary | ICD-10-CM | POA: Diagnosis present

## 2014-12-15 LAB — RAPID URINE DRUG SCREEN, HOSP PERFORMED
AMPHETAMINES: NOT DETECTED
BENZODIAZEPINES: NOT DETECTED
Barbiturates: NOT DETECTED
Cocaine: NOT DETECTED
OPIATES: NOT DETECTED
Tetrahydrocannabinol: NOT DETECTED

## 2014-12-15 LAB — COMPREHENSIVE METABOLIC PANEL
ALBUMIN: 2.1 g/dL — AB (ref 3.5–5.0)
ALT: 43 U/L (ref 17–63)
ANION GAP: 15 (ref 5–15)
AST: 36 U/L (ref 15–41)
Alkaline Phosphatase: 162 U/L — ABNORMAL HIGH (ref 38–126)
BUN: 21 mg/dL — ABNORMAL HIGH (ref 6–20)
CO2: 25 mmol/L (ref 22–32)
Calcium: 8.9 mg/dL (ref 8.9–10.3)
Chloride: 87 mmol/L — ABNORMAL LOW (ref 101–111)
Creatinine, Ser: 0.81 mg/dL (ref 0.61–1.24)
GFR calc Af Amer: >60 mL/min (ref >60)
GFR calc non Af Amer: >60 mL/min (ref >60)
GLUCOSE: 345 mg/dL — AB (ref 65–99)
POTASSIUM: 4.3 mmol/L (ref 3.5–5.1)
SODIUM: 127 mmol/L — AB (ref 135–145)
Total Bilirubin: 0.4 mg/dL (ref 0.3–1.2)
Total Protein: 6.1 g/dL — ABNORMAL LOW (ref 6.5–8.1)

## 2014-12-15 LAB — I-STAT ARTERIAL BLOOD GAS, ED
Acid-Base Excess: 7 mmol/L — ABNORMAL HIGH (ref 0.0–2.0)
BICARBONATE: 32.1 meq/L — AB (ref 20.0–24.0)
O2 Saturation: 100 %
PCO2 ART: 48.3 mmHg — AB (ref 35.0–45.0)
TCO2: 34 mmol/L (ref 0–100)
pH, Arterial: 7.431 (ref 7.350–7.450)
pO2, Arterial: 374 mmHg — ABNORMAL HIGH (ref 80.0–100.0)

## 2014-12-15 LAB — PROTIME-INR
INR: 1.14 (ref 0.00–1.49)
Prothrombin Time: 14.8 seconds (ref 11.6–15.2)

## 2014-12-15 LAB — I-STAT TROPONIN, ED: Troponin i, poc: 0.1 ng/mL (ref 0.00–0.08)

## 2014-12-15 LAB — DIFFERENTIAL
Basophils Absolute: 0 10*3/uL (ref 0.0–0.1)
Basophils Relative: 0 % (ref 0–1)
EOS ABS: 0 10*3/uL (ref 0.0–0.7)
EOS PCT: 0 % (ref 0–5)
LYMPHS PCT: 7 % — AB (ref 12–46)
Lymphs Abs: 1.2 10*3/uL (ref 0.7–4.0)
Monocytes Absolute: 0.9 10*3/uL (ref 0.1–1.0)
Monocytes Relative: 5 % (ref 3–12)
NEUTROS PCT: 88 % — AB (ref 43–77)
Neutro Abs: 15 10*3/uL — ABNORMAL HIGH (ref 1.7–7.7)

## 2014-12-15 LAB — CBG MONITORING, ED: Glucose-Capillary: 272 mg/dL — ABNORMAL HIGH (ref 65–99)

## 2014-12-15 LAB — I-STAT CHEM 8, ED
BUN: 26 mg/dL — AB (ref 6–20)
Calcium, Ion: 1.15 mmol/L (ref 1.13–1.30)
Chloride: 87 mmol/L — ABNORMAL LOW (ref 101–111)
Creatinine, Ser: 0.7 mg/dL (ref 0.61–1.24)
Glucose, Bld: 356 mg/dL — ABNORMAL HIGH (ref 65–99)
HEMATOCRIT: 31 % — AB (ref 39.0–52.0)
Hemoglobin: 10.5 g/dL — ABNORMAL LOW (ref 13.0–17.0)
Potassium: 4.2 mmol/L (ref 3.5–5.1)
SODIUM: 125 mmol/L — AB (ref 135–145)
TCO2: 28 mmol/L (ref 0–100)

## 2014-12-15 LAB — CBC
HCT: 28.2 % — ABNORMAL LOW (ref 39.0–52.0)
Hemoglobin: 9.3 g/dL — ABNORMAL LOW (ref 13.0–17.0)
MCH: 24.9 pg — AB (ref 26.0–34.0)
MCHC: 33 g/dL (ref 30.0–36.0)
MCV: 75.4 fL — ABNORMAL LOW (ref 78.0–100.0)
Platelets: 339 10*3/uL (ref 150–400)
RBC: 3.74 MIL/uL — AB (ref 4.22–5.81)
RDW: 19.3 % — ABNORMAL HIGH (ref 11.5–15.5)
WBC: 17.1 10*3/uL — ABNORMAL HIGH (ref 4.0–10.5)

## 2014-12-15 LAB — ETHANOL: Alcohol, Ethyl (B): <5 mg/dL (ref <5)

## 2014-12-15 LAB — NA AND K (SODIUM & POTASSIUM), RAND UR
POTASSIUM UR: 19 mmol/L
Sodium, Ur: 61 mmol/L

## 2014-12-15 LAB — APTT: aPTT: 27 seconds (ref 24–37)

## 2014-12-15 MED ORDER — SODIUM CHLORIDE 0.9 % IV SOLN
INTRAVENOUS | Status: DC
  Administered 2014-12-15: 18:00:00 via INTRAVENOUS

## 2014-12-15 MED ORDER — ACETAMINOPHEN 650 MG RE SUPP: 650.0000 mg | Freq: Four times a day (QID) | RECTAL | Status: DC | PRN

## 2014-12-15 MED ORDER — SODIUM CHLORIDE 0.9 % IV SOLN: 250.0000 mL | INTRAVENOUS | Status: DC | PRN

## 2014-12-15 MED ORDER — LEVETIRACETAM 500 MG/5ML IV SOLN
500.0000 mg | Freq: Two times a day (BID) | INTRAVENOUS | Status: DC
  Administered 2014-12-16: 500 mg via INTRAVENOUS
  Filled 2014-12-15 (×2): qty 5

## 2014-12-15 MED ORDER — PROPOFOL 1000 MG/100ML IV EMUL
5.0000 ug/kg/min | Freq: Once | INTRAVENOUS | Status: AC
  Administered 2014-12-15: 25 ug/kg/min via INTRAVENOUS
  Filled 2014-12-15: qty 100

## 2014-12-15 MED ORDER — SUCCINYLCHOLINE CHLORIDE 20 MG/ML IJ SOLN
INTRAMUSCULAR | Status: AC
  Filled 2014-12-15: qty 1

## 2014-12-15 MED ORDER — HYDROMORPHONE HCL 1 MG/ML IJ SOLN: 0.5000 mg | INTRAMUSCULAR | Status: DC | PRN

## 2014-12-15 MED ORDER — SODIUM CHLORIDE 0.9 % IV SOLN
1000.0000 mg | Freq: Once | INTRAVENOUS | Status: AC
  Administered 2014-12-15: 1000 mg via INTRAVENOUS
  Filled 2014-12-15: qty 10

## 2014-12-15 MED ORDER — LIDOCAINE HCL (CARDIAC) 20 MG/ML IV SOLN
INTRAVENOUS | Status: AC
  Filled 2014-12-15: qty 5

## 2014-12-15 MED ORDER — ETOMIDATE 2 MG/ML IV SOLN
20.0000 mg | Freq: Once | INTRAVENOUS | Status: AC
  Administered 2014-12-15: 20 mg via INTRAVENOUS

## 2014-12-15 MED ORDER — HYDRALAZINE HCL 20 MG/ML IJ SOLN
5.0000 mg | Freq: Four times a day (QID) | INTRAMUSCULAR | Status: DC | PRN
  Filled 2014-12-15: qty 0.25

## 2014-12-15 MED ORDER — SODIUM CHLORIDE 0.9 % IV SOLN
INTRAVENOUS | Status: DC
Start: 2014-12-15 — End: 2014-12-20
  Administered 2014-12-15: 23:00:00 via INTRAVENOUS
  Administered 2014-12-16: 75 mL/h via INTRAVENOUS
  Administered 2014-12-19: 19:00:00 via INTRAVENOUS

## 2014-12-15 MED ORDER — LACOSAMIDE 200 MG/20ML IV SOLN
200.0000 mg | INTRAVENOUS | Status: AC
  Administered 2014-12-15: 200 mg via INTRAVENOUS
  Filled 2014-12-15: qty 20

## 2014-12-15 MED ORDER — INSULIN ASPART 100 UNIT/ML ~~LOC~~ SOLN
0.0000 [IU] | SUBCUTANEOUS | Status: DC
  Administered 2014-12-15 – 2014-12-16 (×2): 5 [IU] via SUBCUTANEOUS
  Administered 2014-12-16 (×2): 3 [IU] via SUBCUTANEOUS
  Administered 2014-12-16: 5 [IU] via SUBCUTANEOUS
  Administered 2014-12-16: 3 [IU] via SUBCUTANEOUS
  Administered 2014-12-16: 5 [IU] via SUBCUTANEOUS
  Administered 2014-12-17 (×2): 3 [IU] via SUBCUTANEOUS
  Administered 2014-12-17: 1 [IU] via SUBCUTANEOUS
  Administered 2014-12-17 (×2): 5 [IU] via SUBCUTANEOUS
  Administered 2014-12-18: 2 [IU] via SUBCUTANEOUS
  Administered 2014-12-18: 1 [IU] via SUBCUTANEOUS
  Administered 2014-12-18 (×2): 2 [IU] via SUBCUTANEOUS
  Administered 2014-12-18: 9 [IU] via SUBCUTANEOUS
  Administered 2014-12-19: 3 [IU] via SUBCUTANEOUS
  Administered 2014-12-19: 2 [IU] via SUBCUTANEOUS
  Administered 2014-12-19: 3 [IU] via SUBCUTANEOUS
  Administered 2014-12-19: 9 [IU] via SUBCUTANEOUS
  Filled 2014-12-15: qty 1

## 2014-12-15 MED ORDER — ETOMIDATE 2 MG/ML IV SOLN: 25.0000 mg | Freq: Once | INTRAVENOUS | Status: DC

## 2014-12-15 MED ORDER — SODIUM CHLORIDE 0.9 % IV BOLUS (SEPSIS): 1000.0000 mL | Freq: Once | INTRAVENOUS | Status: DC

## 2014-12-15 MED ORDER — DEXAMETHASONE SODIUM PHOSPHATE 10 MG/ML IJ SOLN
10.0000 mg | Freq: Four times a day (QID) | INTRAMUSCULAR | Status: DC
  Administered 2014-12-15 – 2014-12-16 (×3): 10 mg via INTRAVENOUS
  Filled 2014-12-15 (×7): qty 1

## 2014-12-15 MED ORDER — HEPARIN SODIUM (PORCINE) 5000 UNIT/ML IJ SOLN
5000.0000 [IU] | Freq: Three times a day (TID) | INTRAMUSCULAR | Status: DC
  Administered 2014-12-16 – 2014-12-20 (×15): 5000 [IU] via SUBCUTANEOUS
  Filled 2014-12-15 (×14): qty 1

## 2014-12-15 MED ORDER — ROCURONIUM BROMIDE 50 MG/5ML IV SOLN
INTRAVENOUS | Status: AC
Start: 2014-12-15 — End: 2014-12-16
  Filled 2014-12-15: qty 2

## 2014-12-15 MED ORDER — SUCCINYLCHOLINE CHLORIDE 20 MG/ML IJ SOLN
100.0000 mg | Freq: Once | INTRAMUSCULAR | Status: AC
  Administered 2014-12-15: 100 mg via INTRAVENOUS
  Filled 2014-12-15: qty 5

## 2014-12-15 MED ORDER — ETOMIDATE 2 MG/ML IV SOLN
INTRAVENOUS | Status: AC
  Filled 2014-12-15: qty 20

## 2014-12-15 MED ORDER — LORAZEPAM 2 MG/ML IJ SOLN
1.0000 mg | Freq: Once | INTRAMUSCULAR | Status: AC
  Administered 2014-12-15: 1 mg via INTRAVENOUS
  Filled 2014-12-15: qty 1

## 2014-12-15 MED ORDER — SODIUM CHLORIDE 0.9 % IV SOLN
100.0000 mg | Freq: Two times a day (BID) | INTRAVENOUS | Status: DC
  Filled 2014-12-15 (×3): qty 10

## 2014-12-15 MED ORDER — ONDANSETRON HCL 4 MG/2ML IJ SOLN: 4.0000 mg | Freq: Four times a day (QID) | INTRAMUSCULAR | Status: DC | PRN

## 2014-12-15 MED ORDER — ACETAMINOPHEN 325 MG PO TABS: 650.0000 mg | ORAL_TABLET | Freq: Four times a day (QID) | ORAL | Status: DC | PRN

## 2014-12-15 MED ORDER — FENTANYL CITRATE (PF) 100 MCG/2ML IJ SOLN
150.0000 ug | Freq: Once | INTRAMUSCULAR | Status: AC
  Administered 2014-12-15: 150 ug via INTRAVENOUS
  Filled 2014-12-15: qty 4

## 2014-12-15 MED ORDER — ONDANSETRON HCL 4 MG PO TABS: 4.0000 mg | ORAL_TABLET | Freq: Four times a day (QID) | ORAL | Status: DC | PRN

## 2014-12-15 MED ORDER — PANTOPRAZOLE SODIUM 40 MG IV SOLR
40.0000 mg | INTRAVENOUS | Status: DC
  Administered 2014-12-15 – 2014-12-17 (×3): 40 mg via INTRAVENOUS
  Filled 2014-12-15 (×4): qty 40

## 2014-12-15 MED ORDER — LORAZEPAM 2 MG/ML IJ SOLN
1.0000 mg | INTRAMUSCULAR | Status: DC | PRN
  Administered 2014-12-15 (×2): 1 mg via INTRAVENOUS
  Filled 2014-12-15: qty 1

## 2014-12-15 NOTE — ED Notes (Signed)
Dr. Aram Beecham at the bedside.
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
## 2014-12-15 NOTE — Progress Notes (Signed)
Sent letters for travel reimbursement to patient.

## 2014-12-15 NOTE — Progress Notes (Signed)
Pt transported to 7W11 without complication. Report given to [place].

## 2014-12-15 NOTE — Progress Notes (Signed)
Routine EEG started at 2010 per stat order of Dr. Doy Mince; Dr Aram Beecham reviewed tracing and ordered LTVM for pt.

## 2014-12-15 NOTE — Progress Notes (Signed)
Emergency room nurse unable to give report at present time.  Phone number left and the nurse will call report when available.

## 2014-12-15 NOTE — Progress Notes (Signed)
ABG results given to MD.  

## 2014-12-15 NOTE — ED Notes (Signed)
Per ems- Pt was last seen at 0830 this morning at 1500 he had a normal PHONE conversation with his grandchild. Today at 1615 was found with his right side flaccid. EMS witnessed a full body seizure at the home. Is lung cancer pt. BP 1334/70, HR 100, CBG 368, 20RR, 96% RA.
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
## 2014-12-15 NOTE — ED Notes (Signed)
Family at bedside. 

## 2014-12-15 NOTE — ED Notes (Signed)
CBG check of 272

## 2014-12-16 DIAGNOSIS — R569 Unspecified convulsions: Secondary | ICD-10-CM

## 2014-12-16 DIAGNOSIS — J9601 Acute respiratory failure with hypoxia: Secondary | ICD-10-CM | POA: Insufficient documentation

## 2014-12-16 DIAGNOSIS — G40901 Epilepsy, unspecified, not intractable, with status epilepticus: Secondary | ICD-10-CM | POA: Insufficient documentation

## 2014-12-16 LAB — URINALYSIS, ROUTINE W REFLEX MICROSCOPIC
Bilirubin Urine: NEGATIVE
HGB URINE DIPSTICK: NEGATIVE
Ketones, ur: NEGATIVE mg/dL
Leukocytes, UA: NEGATIVE
Nitrite: NEGATIVE
Protein, ur: NEGATIVE mg/dL
Specific Gravity, Urine: 1.021 (ref 1.005–1.030)
Urobilinogen, UA: 1 mg/dL (ref 0.0–1.0)
pH: 7 (ref 5.0–8.0)

## 2014-12-16 LAB — CBC WITH DIFFERENTIAL/PLATELET
BASOS PCT: 0 % (ref 0–1)
Basophils Absolute: 0 10*3/uL (ref 0.0–0.1)
EOS ABS: 0 10*3/uL (ref 0.0–0.7)
EOS PCT: 0 % (ref 0–5)
HCT: 23.2 % — ABNORMAL LOW (ref 39.0–52.0)
HEMOGLOBIN: 7.6 g/dL — AB (ref 13.0–17.0)
LYMPHS ABS: 0.8 10*3/uL (ref 0.7–4.0)
Lymphocytes Relative: 7 % — ABNORMAL LOW (ref 12–46)
MCH: 24.6 pg — AB (ref 26.0–34.0)
MCHC: 32.8 g/dL (ref 30.0–36.0)
MCV: 75.1 fL — ABNORMAL LOW (ref 78.0–100.0)
MONOS PCT: 3 % (ref 3–12)
Monocytes Absolute: 0.4 10*3/uL (ref 0.1–1.0)
NEUTROS PCT: 90 % — AB (ref 43–77)
Neutro Abs: 11 10*3/uL — ABNORMAL HIGH (ref 1.7–7.7)
PLATELETS: 303 10*3/uL (ref 150–400)
RBC: 3.09 MIL/uL — AB (ref 4.22–5.81)
RDW: 19.6 % — ABNORMAL HIGH (ref 11.5–15.5)
WBC: 12.2 10*3/uL — AB (ref 4.0–10.5)

## 2014-12-16 LAB — CBC
HEMATOCRIT: 23.2 % — AB (ref 39.0–52.0)
HEMOGLOBIN: 7.5 g/dL — AB (ref 13.0–17.0)
MCH: 24.7 pg — ABNORMAL LOW (ref 26.0–34.0)
MCHC: 32.3 g/dL (ref 30.0–36.0)
MCV: 76.3 fL — ABNORMAL LOW (ref 78.0–100.0)
Platelets: 287 10*3/uL (ref 150–400)
RBC: 3.04 MIL/uL — AB (ref 4.22–5.81)
RDW: 19.5 % — ABNORMAL HIGH (ref 11.5–15.5)
WBC: 13.4 10*3/uL — ABNORMAL HIGH (ref 4.0–10.5)

## 2014-12-16 LAB — URINE MICROSCOPIC-ADD ON

## 2014-12-16 LAB — BASIC METABOLIC PANEL
ANION GAP: 9 (ref 5–15)
BUN: 16 mg/dL (ref 6–20)
CALCIUM: 8.1 mg/dL — AB (ref 8.9–10.3)
CO2: 27 mmol/L (ref 22–32)
Chloride: 91 mmol/L — ABNORMAL LOW (ref 101–111)
Creatinine, Ser: 0.59 mg/dL — ABNORMAL LOW (ref 0.61–1.24)
GLUCOSE: 261 mg/dL — AB (ref 65–99)
POTASSIUM: 4.1 mmol/L (ref 3.5–5.1)
Sodium: 127 mmol/L — ABNORMAL LOW (ref 135–145)

## 2014-12-16 LAB — GLUCOSE, CAPILLARY
GLUCOSE-CAPILLARY: 275 mg/dL — AB (ref 65–99)
GLUCOSE-CAPILLARY: 271 mg/dL — AB (ref 65–99)
GLUCOSE-CAPILLARY: 246 mg/dL — AB (ref 65–99)
GLUCOSE-CAPILLARY: 282 mg/dL — AB (ref 65–99)
Glucose-Capillary: 247 mg/dL — ABNORMAL HIGH (ref 65–99)
Glucose-Capillary: 229 mg/dL — ABNORMAL HIGH (ref 65–99)
Glucose-Capillary: 274 mg/dL — ABNORMAL HIGH (ref 65–99)

## 2014-12-16 LAB — OSMOLALITY, URINE: Osmolality, Ur: 550 mOsm/kg (ref 390–1090)

## 2014-12-16 LAB — TROPONIN I: TROPONIN I: 0.05 ng/mL — AB (ref <0.031)

## 2014-12-16 LAB — PHOSPHORUS: PHOSPHORUS: 2.5 mg/dL (ref 2.5–4.6)

## 2014-12-16 LAB — HEMOGLOBIN AND HEMATOCRIT, BLOOD
HCT: 25.4 % — ABNORMAL LOW (ref 39.0–52.0)
HEMOGLOBIN: 8.2 g/dL — AB (ref 13.0–17.0)

## 2014-12-16 LAB — MRSA PCR SCREENING: MRSA BY PCR: NEGATIVE

## 2014-12-16 LAB — MAGNESIUM: Magnesium: 1.6 mg/dL — ABNORMAL LOW (ref 1.7–2.4)

## 2014-12-16 LAB — TRIGLYCERIDES: Triglycerides: 48 mg/dL (ref <150)

## 2014-12-16 MED ORDER — SODIUM CHLORIDE 0.9 % IV SOLN
150.0000 mg | Freq: Two times a day (BID) | INTRAVENOUS | Status: DC
  Filled 2014-12-16 (×3): qty 15

## 2014-12-16 MED ORDER — CLOPIDOGREL 45 MG/30 ML ORAL SUSPENSION
75.0000 mg | Freq: Every day | ORAL | Status: DC
  Filled 2014-12-16: qty 60

## 2014-12-16 MED ORDER — DEXAMETHASONE SODIUM PHOSPHATE 10 MG/ML IJ SOLN
5.0000 mg | Freq: Four times a day (QID) | INTRAMUSCULAR | Status: DC
  Administered 2014-12-16 – 2014-12-17 (×5): 5 mg via INTRAVENOUS
  Filled 2014-12-16 (×4): qty 0.5

## 2014-12-16 MED ORDER — SODIUM CHLORIDE 0.9 % IV SOLN
500.0000 mg | Freq: Once | INTRAVENOUS | Status: AC
  Administered 2014-12-16: 500 mg via INTRAVENOUS
  Filled 2014-12-16 (×2): qty 5

## 2014-12-16 MED ORDER — CHLORHEXIDINE GLUCONATE 0.12% ORAL RINSE (MEDLINE KIT)
15.0000 mL | Freq: Two times a day (BID) | OROMUCOSAL | Status: DC
  Administered 2014-12-16 – 2014-12-17 (×4): 15 mL via OROMUCOSAL

## 2014-12-16 MED ORDER — VITAL HIGH PROTEIN PO LIQD: 1000.0000 mL | ORAL | Status: DC

## 2014-12-16 MED ORDER — CLOPIDOGREL BISULFATE 75 MG PO TABS
75.0000 mg | ORAL_TABLET | Freq: Every day | ORAL | Status: DC
  Administered 2014-12-17 – 2014-12-20 (×4): 75 mg via ORAL
  Filled 2014-12-16 (×4): qty 1

## 2014-12-16 MED ORDER — LACOSAMIDE 200 MG/20ML IV SOLN
50.0000 mg | Freq: Two times a day (BID) | INTRAVENOUS | Status: DC
  Administered 2014-12-16 (×2): 50 mg via INTRAVENOUS
  Filled 2014-12-16 (×6): qty 5

## 2014-12-16 MED ORDER — MAGNESIUM SULFATE 2 GM/50ML IV SOLN
2.0000 g | Freq: Once | INTRAVENOUS | Status: AC
  Administered 2014-12-16: 2 g via INTRAVENOUS
  Filled 2014-12-16: qty 50

## 2014-12-16 MED ORDER — SODIUM CHLORIDE 0.9 % IV SOLN
1000.0000 mg | Freq: Two times a day (BID) | INTRAVENOUS | Status: DC
  Administered 2014-12-16 – 2014-12-18 (×4): 1000 mg via INTRAVENOUS
  Filled 2014-12-16 (×5): qty 10

## 2014-12-16 MED ORDER — PROPOFOL 1000 MG/100ML IV EMUL
5.0000 ug/kg/min | INTRAVENOUS | Status: DC
  Administered 2014-12-16 (×3): 30 ug/kg/min via INTRAVENOUS
  Administered 2014-12-16 – 2014-12-17 (×3): 40 ug/kg/min via INTRAVENOUS
  Filled 2014-12-16 (×7): qty 100

## 2014-12-16 MED ORDER — PRO-STAT SUGAR FREE PO LIQD
30.0000 mL | Freq: Three times a day (TID) | ORAL | Status: DC
Start: 2014-12-16 — End: 2014-12-18
  Administered 2014-12-16 – 2014-12-17 (×4): 30 mL
  Filled 2014-12-16 (×8): qty 30

## 2014-12-16 MED ORDER — VITAL AF 1.2 CAL PO LIQD
1000.0000 mL | ORAL | Status: DC
  Administered 2014-12-16: 1000 mL
  Filled 2014-12-16 (×5): qty 1000

## 2014-12-16 MED ORDER — ANTISEPTIC ORAL RINSE SOLUTION (CORINZ)
7.0000 mL | Freq: Four times a day (QID) | OROMUCOSAL | Status: DC
  Administered 2014-12-16 – 2014-12-18 (×9): 7 mL via OROMUCOSAL

## 2014-12-16 NOTE — Progress Notes (Addendum)
Subjective: Patient intubated and sedated but some response still noted with stimulation per nursing.  No clinical seizure activity noted.  On LTM due to noted subclinical seizure activity.  On Keppra.  Vimpat initiated overnight.    Objective: Current vital signs: BP 112/57 mmHg  Pulse 58  Temp(Src) 97.3 F (36.3 C) (Core (Comment))  Resp 18  Ht '6\' 1"'$  (1.854 m)  Wt 80.3 kg (177 lb 0.5 oz)  BMI 23.36 kg/m2  SpO2 100% Vital signs in last 24 hours: Temp:  [97.2 F (36.2 C)-99.1 F (37.3 C)] 97.3 F (36.3 C) (08/20 0842) Pulse Rate:  [58-96] 58 (08/20 0842) Resp:  [14-19] 18 (08/20 0842) BP: (111-170)/(54-73) 112/57 mmHg (08/20 0842) SpO2:  [89 %-100 %] 100 % (08/20 0842) FiO2 (%):  [60 %-100 %] 60 % (08/20 0842) Weight:  [80.3 kg (177 lb 0.5 oz)] 80.3 kg (177 lb 0.5 oz) (08/20 0421)  Intake/Output from previous day: 08/19 0701 - 08/20 0700 In: 708.5 [I.V.:708.5] Out: 1790 [Urine:1790] Intake/Output this shift: Total I/O In: 244.5 [I.V.:89.5; IV Piggyback:155] Out: 225 [Urine:225] Nutritional status: Diet NPO time specified  Neurologic Exam: Mental Status: Sleeping.  Responds to noxious stimuli.  Does not follow commands.  No attempts at speech.   Cranial Nerves: II: Discs flat bilaterally; Patient does not blink to bilateral confrontation. Pupils equal, round, reactive to light and accommodation. Left pupil glazed. III,IV, VI: Intact oculocephalic maneuvers.  V,VII: right facial droop, weak corneals bilaterally.  VIII: unable to test IX,X: gag reflex reduced XI: unable to test XII: unable to test Motor: Spontaneous movement noted on the left upper extremity and both lower extremities.  No right upper extremity movement noted.    Sensory: Responds to noxious stimuli on the RUE.  Minimal response to noxious stimuli otherwise.   Deep Tendon Reflexes: 2+ in the upper extremities, 1+ at the knees and absent at the ankles.  Plantars: Right:  upgoingLeft: upgoing Cerebellar: unable to test Gait: unable to test due to safety concerns   Lab Results: Basic Metabolic Panel:  Recent Labs Lab 12/15/14 1731 12/15/14 1734 12/16/14 0245  NA 127* 125* 127*  K 4.3 4.2 4.1  CL 87* 87* 91*  CO2 25  --  27  GLUCOSE 345* 356* 261*  BUN 21* 26* 16  CREATININE 0.81 0.70 0.59*  CALCIUM 8.9  --  8.1*  MG  --   --  1.6*  PHOS  --   --  2.5    Liver Function Tests:  Recent Labs Lab 12/15/14 1731  AST 36  ALT 43  ALKPHOS 162*  BILITOT 0.4  PROT 6.1*  ALBUMIN 2.1*   No results for input(s): LIPASE, AMYLASE in the last 168 hours. No results for input(s): AMMONIA in the last 168 hours.  CBC:  Recent Labs Lab 12/15/14 1731 12/15/14 1734 12/16/14 0245 12/16/14 0407  WBC 17.1*  --  13.4*  --   NEUTROABS 15.0*  --   --   --   HGB 9.3* 10.5* 7.5* 8.2*  HCT 28.2* 31.0* 23.2* 25.4*  MCV 75.4*  --  76.3*  --   PLT 339  --  287  --     Cardiac Enzymes:  Recent Labs Lab 12/16/14 0407  TROPONINI 0.05*    Lipid Panel:  Recent Labs Lab 12/16/14 0407  TRIG 48    CBG:  Recent Labs Lab 12/15/14 2055 12/16/14 0107 12/16/14 0303  GLUCAP 272* 247* 275*    Microbiology: Results for orders placed or performed  during the hospital encounter of 12/15/14  MRSA PCR Screening     Status: None   Collection Time: 12/16/14 12:13 AM  Result Value Ref Range Status   MRSA by PCR NEGATIVE NEGATIVE Final    Comment:        The GeneXpert MRSA Assay (FDA approved for NASAL specimens only), is one component of a comprehensive MRSA colonization surveillance program. It is not intended to diagnose MRSA infection nor to guide or monitor treatment for MRSA infections.     Coagulation Studies:  Recent Labs  12/15/14 1731  LABPROT 14.8  INR 1.14    Imaging: Ct Head Wo Contrast  12/15/2014   CLINICAL DATA:  Patient with right-sided weakness. History of lung and liver cancer. Possible  seizure.  EXAM: CT HEAD WITHOUT CONTRAST  TECHNIQUE: Contiguous axial images were obtained from the base of the skull through the vertex without intravenous contrast.  COMPARISON:  PET-CT 12/04/2014; brain CT 02/03/2013  FINDINGS: There is a 0.8 cm dense mass within the right cerebellar hemisphere (image 8; series 2) with surrounding edema. Periventricular and subcortical white matter hypodensity compatible with chronic small vessel ischemic changes. Age-indeterminate patchy hypodensities within the right thalamus. Age-indeterminate hypodensity within the right frontal lobe white matter. No evidence for significant intracranial hemorrhage or mass effect. Orbits are unremarkable. Fluid within the right maxillary sinus. Small amount of fluid within the left sphenoid sinus. Left calvarial postoperative changes. Right frontal burr hole.  IMPRESSION: Interval development of an 8 mm mass within the right cerebral hemisphere with surrounding edema, concerning for metastasis.  Patchy hypodensities within the right thalamus are nonspecific however age-indeterminate infarct is not excluded.  Age-indeterminate hypodensities within the right frontal lobe white matter which may represent an age indeterminate infarct or potentially from prior intracranial device as there is an overlying burr hole in this location.  Critical Value/emergent results were called by telephone at the time of interpretation on 12/15/2014 at 5:51 pm to Dr. Doy Mince, who verbally acknowledged these results.   Electronically Signed   By: Lovey Newcomer M.D.   On: 12/15/2014 17:55   Dg Chest Portable 1 View  12/15/2014   CLINICAL DATA:  Endotracheal tube placement.  Initial encounter.  EXAM: PORTABLE CHEST - 1 VIEW  COMPARISON:  Chest radiograph performed earlier today at 7:12 p.m.  FINDINGS: The patient's endotracheal tube is seen ending 4 cm above the carina. An enteric tube is noted extending below the diaphragm. A right-sided chest port is seen ending  about the mid SVC.  There is right-sided volume loss, with mild rightward mediastinal shift. Patchy right-sided airspace opacity is more prominent peripherally, raising concern for pneumonia. No definite pleural effusion or pneumothorax is seen.  The cardiomediastinal silhouette is borderline normal in size. No acute osseous abnormalities are seen.  IMPRESSION: 1. Endotracheal tube seen ending 4 cm above the carina. 2. New right-sided volume loss, with patchy right-sided airspace opacity, which may reflect pneumonia,   Electronically Signed   By: Garald Balding M.D.   On: 12/15/2014 22:03   Dg Chest Portable 1 View  12/15/2014   CLINICAL DATA:  Pneumonia  EXAM: PORTABLE CHEST - 1 VIEW  COMPARISON:  None. Patient's prior x-ray from 2002 is not available for comparison.  FINDINGS: The heart size is enlarged. The aorta is tortuous. A right central venous line is identified with distal tip in superior vena cava. There is no focal infiltrate, pulmonary edema, or pleural effusion. No acute abnormalities identified within the visualized bones. The  visualized skeletal structures are unremarkable.  IMPRESSION: No active cardiopulmonary disease.   Electronically Signed   By: Abelardo Diesel M.D.   On: 12/15/2014 19:31    Medications:  I have reviewed the patient's current medications. Scheduled: . antiseptic oral rinse  7 mL Mouth Rinse QID  . chlorhexidine gluconate  15 mL Mouth Rinse BID  . dexamethasone  10 mg Intravenous 4 times per day  . etomidate      . heparin  5,000 Units Subcutaneous 3 times per day  . insulin aspart  0-9 Units Subcutaneous 6 times per day  . lacosamide (VIMPAT) IV  100 mg Intravenous Q12H  . levETIRAcetam  500 mg Intravenous Q12H  . lidocaine (cardiac) 100 mg/35m      . pantoprazole (PROTONIX) IV  40 mg Intravenous Q24H  . rocuronium      . succinylcholine        Assessment/Plan: Patient with frequent left sharp activity on LTM wtihin a breach rhythm due to previous surgery.   Not felt to be in status.  Keppra at '500mg'$  BID and Vimpat at '100mg'$  BID.  Propofol at 359m.  No clinical seizure activity noted.    Recommendations: 1.  Increase Keppra to '1000mg'$  BID.  '500mg'$  to be given now.   2.  Continue Propofol at 3080mwith plans to discontinue as tolerated per respiratory issues.   3.  Discontinue LTM 4.  Taper Vimpat until discontinued.     LOS: 1 day   LesAlexis GoodellD [facility] [id number]  8:44 AM

## 2014-12-16 NOTE — Progress Notes (Signed)
LTM EEG discontinued per Dr Doy Mince.

## 2014-12-16 NOTE — Procedures (Signed)
Continuous  11 hour Long-Term Video EEG Monitoring  Requesting Physician: Dr. Alexis Goodell  Reading Physician: Dr. Jaquita Folds  History: This is a 69 year old white male who was noted to have a generalized tonic-clonic seizure with right sided weakness.He  has a history of a left subdural drainage as well as metastatic lung cancer.  Medications: Keppra.  Technique: This is a continuous  11 hour 19 channel video EEG monitoring with 18 channels of EEG as well as one channel of EKG.  It was performed during a state of stupor.Neither photic stimulation nor hyperventilation were performed as activating procedures.  Description: As the tracing opens, the background consisted of some generalized 7-8 Hz delta theta activity with a voltage range of 7 to 28 microvolts. Superimposed upon this background was a prominent breach rhythm which was present over the left hemisphere particularly the left frontocentral and parietal regions. There was  also noted to be some asymmetry in amplitude with higher voltage activity noted over the left hemisphere with some asymmetric left hemispheric slowing. As the recording continued, the slowing and voltage asymmetry gradually resolved although there was  still a prominent breach rhythm noted over the left hemisphere with some rare left frontal central and  frontal temporal sharp waves. During  the monitoring the patient was intubated and sedated. There  were no pushbutton events noted during the monitoring.  Electrodiagnostic Diagnosis: This was an abnormal 11 hour continuous video EEG monitoring as demonstrated by the presence of some mild diffuse slowing with some superimposed left hemispheric slowing as well as a prominent breach rhythm which was  most pronounced over the left frontal central and left frontal temporal head regions.  There were some rare left frontal central and left frontal temporal sharp waves noted.  There were no electrographic seizures noted  during the monitoring.  Clinical Correlation: The  above findings are consistent with a mild diffuse or multifocal encephalopathy as can be seen secondary to medication effect, infection or other diffuse metabolic abnormalities.  The presence of some asymmetric left hemispheric slowing is suggestive of a functional or structural lesion as can be seen with a postictal state, focal area of encephalitis, hemorrhage or stroke.  The presence of a breach rhythm is  secondary to the patient's prior craniotomy defect. The presence of some rare frontal temporal and frontal central sharp waves is suggestive of a partial epilepsy.  Dr. Doy Mince was notified of the results of the time of the reading.    Consuello Bossier, M.D.

## 2014-12-16 NOTE — Progress Notes (Signed)
Initial Nutrition Assessment  DOCUMENTATION CODES:   Severe malnutrition in context of chronic illness  INTERVENTION:    Initiate Vital AF 1.2 @ 25 ml/hr and increase by 10 ml every 4 hours to goal rate of 45 ml/hr  Provide 30 ml Prostat TID  Provides: 1596 kcal, 126 grams protein, and 875 ml H2O  TF regimen and propofol at current rate provides 1978 total kcal/day (104 % of kcal needs)   NUTRITION DIAGNOSIS:   Malnutrition related to chronic illness as evidenced by severe depletion of body fat, severe depletion of muscle mass, 13 percent weight loss in 4 months   GOAL:   Patient will meet greater than or equal to 90% of their needs   MONITOR:   TF tolerance, Vent status, Labs, I & O's, Weight trends  REASON FOR ASSESSMENT:   Malnutrition Screening Tool, Ventilator    ASSESSMENT:   Pt with a history of Lung Cancer with Mets to the Brain S/P Radiation Rx with completion of 10 cycles 2 days ago (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 Per CT pt with brain mass/metastatic disease.   Patient is currently intubated on ventilator support MV: 11 L/min Temp (24hrs), Avg:97.4 F (36.3 C), Min:97.2 F (36.2 C), Max:99.1 F (37.3 C)  Propofol: 14.5 ml/hr = 382 kcal/day from lipid  Labs reviewed: sodium low, magnesium low CBG's: 247-275 (on decadron)  Had lengthy conversation with daughter (works on Scientist, research (medical) at [employer]). She is patients primary support person. 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 He recently completed XRT on his brain with plans to start chemo.  Pt has begun to eat better recently. Daughter has been offering pt glucerna. She has had a difficult time working with providers at [place].  Spoke with pt's RN and MD, will start feedings today.   Diet Order:  Diet NPO time  specified  Skin:  Reviewed, no issues  Last BM:  unknown  Height:   Ht Readings from Last 1 Encounters:  12/16/14 '6\' 1"'$  (1.854 m)    Weight:   Wt Readings from Last 1 Encounters:  12/16/14 177 lb 0.5 oz (80.3 kg)    Ideal Body Weight:  80.9 kg  BMI:  Body mass index is 23.36 kg/(m^2).  Estimated Nutritional Needs:   Kcal:  1909  Protein:  110-125 grams  Fluid:  > 1.9 L/day  EDUCATION NEEDS:   No education needs identified at this time  [place], [place], [place] Pager [phone] After Hours Pager

## 2014-12-16 NOTE — Progress Notes (Addendum)
CRITICAL VALUE ALERT  Critical value received:  HBG 7.5  Date of notification:  12/16/14  Time of notification:  0330  Critical value read back:Yes.    Nurse who received alert:  Vita Barley, RN  MD notified (1st page):  Dr. Ashok Cordia   Time of first page:  0330  MD notified (2nd page):  Time of second page:  Responding MD:  Dr. Ashok Cordia  Time MD responded:  4835
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
## 2014-12-16 NOTE — Progress Notes (Signed)
Neuro end of shift note.  Patient EEG showed electrographic seizures emanating from the left hemisphere. He was given IV ativan, loaded with vimpat 200 mg (had previously received a loading dose of IV keppra) that did not terminate the seizures. Patient was intubated and started on propofol without further evidence of electrographic seizures. Continue vimpat 100 mg BID, keppra 500 BID.  Dorian Pod, MD [facility]

## 2014-12-16 NOTE — Progress Notes (Signed)
[facility] ADULT ICU REPLACEMENT PROTOCOL FOR AM LAB REPLACEMENT ONLY  The patient does apply for the [facility] Adult ICU Electrolyte Replacment Protocol based on the criteria listed below:   1. Is GFR >/= 40 ml/min? Yes.    Patient's GFR today is >60  2. Is urine output >/= 0.5 ml/kg/hr for the last 6 hours? Yes.   Patient's UOP is 0.89 ml/kg/hr 3. Is BUN < 60 mg/dL? Yes.    Patient's BUN today is 16 4. Abnormal electrolyte(s): Magnesium, 1.6 5. Ordered repletion with: [facility] adult ICU replacement protocol 6. If a panic level lab has been reported, has the CCM MD in charge been notified? Yes.  .   Physician:  Tera Partridge  Josem Kaufmann E 12/16/2014 5:32 AM

## 2014-12-16 NOTE — Plan of Care (Signed)
Problem: Phase I Progression Outcomes Goal: Maintaining airway and VS stable Outcome: Progressing Pt is intubated and VS are stable since admission to our unit.

## 2014-12-16 NOTE — Progress Notes (Signed)
eLink Physician-Brief Progress Note Patient Name: NELTON AMSDEN DOB: 08-25-1945 MRN: [id number]   Date of Service  12/16/2014  HPI/Events of Note  RN notified MD of Hgb decline. No signs of active bleeding.  eICU Interventions  Stat Hgb/Hct     Intervention Category Intermediate Interventions: Other:  Tera Partridge 12/16/2014, 3:36 AM

## 2014-12-16 NOTE — H&P (Signed)
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   SIGNIFICANT EVENTS / STUDIES:  Intubated in ED after being dosed with increasing doses of sedatives  LINES / TUBES: ETT 8/20> NGT 8/20> Foley 8/20>  CULTURES: None  ANTIBIOTICS: None  SUBJECTIVE: remains sedated on vent  VITAL SIGNS: Temp:  [97.2 F (36.2 C)-99.1 F (37.3 C)] 97.3 F (36.3 C) (08/20 0900) Pulse Rate:  [58-96] 58 (08/20 0900) Resp:  [14-19] 18 (08/20 0900) BP: (108-170)/(52-73) 108/52 mmHg (08/20 0900) SpO2:  [89 %-100 %] 100 % (08/20 0900) FiO2 (%):  [40 %-100 %] 40 % (08/20 0900) Weight:  [177 lb 0.5 oz (80.3 kg)] 177 lb 0.5 oz (80.3 kg) (08/20 0421) HEMODYNAMICS:   VENTILATOR SETTINGS: Vent Mode:  [-] PRVC FiO2 (%):  [40 %-100 %] 40 % Set Rate:  [14 bmp-18 bmp] 18 bmp Vt Set:  [620 mL] 620 mL PEEP:  [5 cmH20] 5 cmH20 Plateau Pressure:  [18 cmH20-24 cmH20] 18 cmH20 INTAKE / OUTPUT: Intake/Output      08/19 0701 - 08/20 0700 08/20 0701 - 08/21 0700   I.V. (mL/kg) 708.5 (8.8) 196.9 (2.5)   IV Piggyback  155   Total Intake(mL/kg) 708.5 (8.8) 351.9 (4.4)   Urine (mL/kg/hr) 1790 225 (0.8)   Total Output 1790 225   Net -1081.5 +126.9          PHYSICAL EXAMINATION: General:  Sedated on vent HENT: NCAT ETT in place PULM: CTA B CV: RRR, no mgr GI: BS+, soft, nontender DERM: massive pitting edema R> L leg Neuro: heavily sedated  LABS:  CBC  Recent Labs Lab 12/15/14 1731 12/15/14 1734 12/16/14 0245 12/16/14 0407  WBC 17.1*  --  13.4*  --   HGB 9.3* 10.5* 7.5* 8.2*  HCT 28.2* 31.0* 23.2* 25.4*  PLT 339  --  287  --    Coag's  Recent Labs Lab 12/15/14 1731  APTT 27  INR 1.14    BMET  Recent Labs Lab 12/15/14 1731 12/15/14 1734 12/16/14 0245  NA 127* 125* 127*  K 4.3 4.2 4.1  CL 87* 87* 91*  CO2 25  --  27  BUN 21* 26* 16  CREATININE 0.81 0.70 0.59*  GLUCOSE 345* 356* 261*   Electrolytes  Recent Labs Lab 12/15/14 1731 12/16/14 0245  CALCIUM 8.9 8.1*  MG  --  1.6*  PHOS  --  2.5   Sepsis Markers No results for input(s): LATICACIDVEN, PROCALCITON, O2SATVEN in the last 168 hours. ABG  Recent Labs Lab 12/15/14 2248  PHART 7.431  PCO2ART 48.3*  PO2ART 374.0*   Liver Enzymes  Recent Labs Lab 12/15/14 1731  AST 36  ALT 43  ALKPHOS 162*  BILITOT 0.4  ALBUMIN 2.1*   Cardiac Enzymes  Recent Labs Lab 12/16/14 0407  TROPONINI 0.05*   Glucose  Recent Labs Lab 12/15/14 2055 12/16/14 0107 12/16/14 0303 12/16/14 0727  GLUCAP 272* 247* 275* 271*    Imaging Ct Head Wo Contrast  12/15/2014   CLINICAL DATA:  Patient with right-sided weakness. History of lung and liver cancer. Possible seizure.  EXAM: CT HEAD WITHOUT CONTRAST  TECHNIQUE: Contiguous axial images were obtained from the base of the skull through the vertex without intravenous  contrast.  COMPARISON:  PET-CT 12/04/2014; brain CT 02/03/2013  FINDINGS: There is a 0.8 cm dense mass within the right cerebellar hemisphere (image 8; series 2) with surrounding edema. Periventricular and subcortical white matter hypodensity compatible with chronic small vessel ischemic changes. Age-indeterminate patchy hypodensities within the right thalamus. Age-indeterminate hypodensity within the right frontal lobe white matter. No evidence for significant intracranial hemorrhage or mass effect. Orbits are unremarkable. Fluid within the right maxillary sinus. Small amount of fluid within the left sphenoid sinus. Left calvarial postoperative changes. Right frontal burr hole.  IMPRESSION: Interval development of an 8 mm mass within the right cerebral hemisphere with surrounding edema, concerning for  metastasis.  Patchy hypodensities within the right thalamus are nonspecific however age-indeterminate infarct is not excluded.  Age-indeterminate hypodensities within the right frontal lobe white matter which may represent an age indeterminate infarct or potentially from prior intracranial device as there is an overlying burr hole in this location.  Critical Value/emergent results were called by telephone at the time of interpretation on 12/15/2014 at 5:51 pm to Dr. Doy Mince, who verbally acknowledged these results.   Electronically Signed   By: Lovey Newcomer M.D.   On: 12/15/2014 17:55   Dg Chest Portable 1 View  12/15/2014   CLINICAL DATA:  Endotracheal tube placement.  Initial encounter.  EXAM: PORTABLE CHEST - 1 VIEW  COMPARISON:  Chest radiograph performed earlier today at 7:12 p.m.  FINDINGS: The patient's endotracheal tube is seen ending 4 cm above the carina. An enteric tube is noted extending below the diaphragm. A right-sided chest port is seen ending about the mid SVC.  There is right-sided volume loss, with mild rightward mediastinal shift. Patchy right-sided airspace opacity is more prominent peripherally, raising concern for pneumonia. No definite pleural effusion or pneumothorax is seen.  The cardiomediastinal silhouette is borderline normal in size. No acute osseous abnormalities are seen.  IMPRESSION: 1. Endotracheal tube seen ending 4 cm above the carina. 2. New right-sided volume loss, with patchy right-sided airspace opacity, which may reflect pneumonia,   Electronically Signed   By: Garald Balding M.D.   On: 12/15/2014 22:03   Dg Chest Portable 1 View  12/15/2014   CLINICAL DATA:  Pneumonia  EXAM: PORTABLE CHEST - 1 VIEW  COMPARISON:  None. Patient's prior x-ray from 2002 is not available for comparison.  FINDINGS: The heart size is enlarged. The aorta is tortuous. A right central venous line is identified with distal tip in superior vena cava. There is no focal infiltrate, pulmonary edema,  or pleural effusion. No acute abnormalities identified within the visualized bones. The visualized skeletal structures are unremarkable.  IMPRESSION: No active cardiopulmonary disease.   Electronically Signed   By: Abelardo Diesel M.D.   On: 12/15/2014 19:31    EKG: NSR CXR 8/19 reviewed>  bibasilar atelectasis, R lung infiltrate, ETT in place, port in place  ASSESSMENT / PLAN:  Principal Problem:   Seizures Active Problems:   Metastasis to brain   Primary cancer of right lower lobe of lung   Seizure   Hypertension   Hyponatremia   COPD (chronic obstructive pulmonary disease)   Leukocytosis   Anemia   Hyperglycemia   PULMONARY A: Stage IV adenocarcinoma of the lung Need for mechanical ventilation due to heavy sedation, but vent mechanics and oxygenation normal COPD, not in exacerbation Right lung infiltrate? 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 Less likely sepsis. P:   Monitor   INFECTIOUS A: R lung infiltrate but does not have evidence of pneumonia P:   Repeat CXR Monitor for fever  ENDOCRINE A: On dexamethasone for cerebral edema reduction Hyperglycemia P:   Wean dexamethasone dose given hyperglycemia Continue SSI  NEUROLOGIC A: Status epilepticus P:   Apprecaite neuro recs Keppra, Vimpat, and Propofol > wean today Cont. EEG monitoring.  BEST PRACTICE / DISPOSITION Level of Care:  ICU Primary Service:  PCCM Consultants:  Neurology Code Status:  Full Diet:  NPO DVT Px:  Heparin GI Px:  None Skin Integrity:   Intact Social / Family:  Updated on admission  TODAY'S SUMMARY: 69 y/o man with stage IV lung adeno CA admitted with status epilepticus, likely related to brain mets > now following commands, wean sedation per neurology  I have personally obtained a history, examined the patient, evaluated laboratory and imaging results, formulated the assessment and plan and placed orders.  CRITICAL CARE: The patient is critically ill with multiple organ systems failure and requires high complexity decision making for assessment and support, frequent evaluation and titration of therapies, application of advanced monitoring technologies and extensive interpretation of multiple databases. Critical Care Time devoted to patient care services described in this note is 45 minutes (adittional by [facility] 8/20 AM)  Roselie Awkward, MD [place] [facility] Pager: [phone] Cell: [phone] After 3pm or if no response, call [phone]    12/16/2014, 10:32 AM

## 2014-12-17 ENCOUNTER — Inpatient Hospital Stay (HOSPITAL_COMMUNITY): Payer: Non-veteran care

## 2014-12-17 DIAGNOSIS — R609 Edema, unspecified: Secondary | ICD-10-CM

## 2014-12-17 LAB — GLUCOSE, CAPILLARY
GLUCOSE-CAPILLARY: 250 mg/dL — AB (ref 65–99)
Glucose-Capillary: 250 mg/dL — ABNORMAL HIGH (ref 65–99)
Glucose-Capillary: 289 mg/dL — ABNORMAL HIGH (ref 65–99)
Glucose-Capillary: 110 mg/dL — ABNORMAL HIGH (ref 65–99)
Glucose-Capillary: 130 mg/dL — ABNORMAL HIGH (ref 65–99)
Glucose-Capillary: 110 mg/dL — ABNORMAL HIGH (ref 65–99)

## 2014-12-17 LAB — BASIC METABOLIC PANEL
Anion gap: 10 (ref 5–15)
BUN: 20 mg/dL (ref 6–20)
CALCIUM: 7.9 mg/dL — AB (ref 8.9–10.3)
CHLORIDE: 97 mmol/L — AB (ref 101–111)
CO2: 23 mmol/L (ref 22–32)
CREATININE: 0.62 mg/dL (ref 0.61–1.24)
Glucose, Bld: 266 mg/dL — ABNORMAL HIGH (ref 65–99)
Potassium: 4.6 mmol/L (ref 3.5–5.1)
SODIUM: 130 mmol/L — AB (ref 135–145)

## 2014-12-17 LAB — MAGNESIUM: MAGNESIUM: 1.6 mg/dL — AB (ref 1.7–2.4)

## 2014-12-17 LAB — TRIGLYCERIDES: TRIGLYCERIDES: 157 mg/dL — AB (ref <150)

## 2014-12-17 MED ORDER — MAGNESIUM SULFATE 2 GM/50ML IV SOLN
2.0000 g | Freq: Once | INTRAVENOUS | Status: AC
  Administered 2014-12-17: 2 g via INTRAVENOUS
  Filled 2014-12-17: qty 50

## 2014-12-17 MED ORDER — DEXAMETHASONE SODIUM PHOSPHATE 10 MG/ML IJ SOLN
4.0000 mg | Freq: Three times a day (TID) | INTRAMUSCULAR | Status: DC
  Administered 2014-12-17 – 2014-12-20 (×8): 4 mg via INTRAVENOUS
  Filled 2014-12-17 (×2): qty 0.4
  Filled 2014-12-17: qty 1
  Filled 2014-12-17: qty 0.4
  Filled 2014-12-17 (×3): qty 1

## 2014-12-17 MED ORDER — SODIUM CHLORIDE 0.9 % IV SOLN
50.0000 mg | Freq: Every day | INTRAVENOUS | Status: DC
  Administered 2014-12-17 – 2014-12-18 (×2): 50 mg via INTRAVENOUS
  Filled 2014-12-17 (×3): qty 5

## 2014-12-17 MED ORDER — HALOPERIDOL LACTATE 5 MG/ML IJ SOLN
1.0000 mg | INTRAMUSCULAR | Status: DC | PRN
  Administered 2014-12-17: 4 mg via INTRAVENOUS
  Administered 2014-12-18: 2 mg via INTRAVENOUS
  Administered 2014-12-19: 4 mg via INTRAVENOUS
  Filled 2014-12-17 (×4): qty 1

## 2014-12-17 MED ORDER — FENTANYL CITRATE (PF) 100 MCG/2ML IJ SOLN: 12.5000 ug | INTRAMUSCULAR | Status: DC | PRN

## 2014-12-17 NOTE — Progress Notes (Signed)
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   SIGNIFICANT EVENTS / STUDIES:  Intubated in ED after being dosed with increasing doses of sedatives  LINES / TUBES: ETT 8/20> 8/21 NGT 8/20> Foley 8/20>  CULTURES: None  ANTIBIOTICS: None  SUBJECTIVE: awake, alert, SBT going well  VITAL SIGNS: Temp:  [97 F (36.1 C)-99 F (37.2 C)] 98.6 F (37 C) (08/21 1030) Pulse Rate:  [55-76] 74 (08/21 1030) Resp:  [18-22] 19 (08/21 1030) BP: (115-191)/(49-87) 144/67 mmHg (08/21 1030) SpO2:  [96 %-100 %] 100 % (08/21 1030) FiO2 (%):  [30 %-40 %] 30 % (08/21 1030) Weight:  [80.5 kg (177 lb 7.5 oz)] 80.5 kg (177 lb 7.5 oz) (08/21 0400) HEMODYNAMICS:   VENTILATOR SETTINGS: Vent Mode:  [-] PSV;CPAP FiO2 (%):  [30 %-40 %] 30 % Set Rate:  [18 bmp] 18 bmp Vt Set:  [620 mL] 620 mL PEEP:  [5 cmH20] 5 cmH20 Plateau Pressure:  [18 cmH20-20 cmH20] 18 cmH20 INTAKE / OUTPUT: Intake/Output      08/20 0701 - 08/21 0700 08/21 0701 - 08/22 0700   I.V. (mL/kg) 2358.8 (29.3) 255.5 (3.2)   NG/GT 903.8 195   IV Piggyback 480 110   Total Intake(mL/kg) 3742.5 (46.5) 560.5 (7)   Urine (mL/kg/hr) 2870 (1.5) 350 (1)   Total Output 2870 350   Net +872.5 +210.5          PHYSICAL EXAMINATION: General:  Awake on vent HENT: NCAT ETT in place PULM: CTA B CV: RRR, no mgr GI: BS+, soft, nontender DERM: massive pitting edema R> L leg Neuro: Awake, alert, follows commands  LABS:  CBC  Recent Labs Lab 12/15/14 1731  12/16/14 0245 12/16/14 0407 12/16/14 1225  WBC 17.1*  --  13.4*  --  12.2*  HGB 9.3*  < > 7.5* 8.2* 7.6*  HCT 28.2*  < > 23.2* 25.4* 23.2*  PLT 339  --  287  --  303  < > = values in this  interval not displayed. Coag's  Recent Labs Lab 12/15/14 1731  APTT 27  INR 1.14   BMET  Recent Labs Lab 12/15/14 1731 12/15/14 1734 12/16/14 0245 12/17/14 0230  NA 127* 125* 127* 130*  K 4.3 4.2 4.1 4.6  CL 87* 87* 91* 97*  CO2 25  --  27 23  BUN 21* 26* 16 20  CREATININE 0.81 0.70 0.59* 0.62  GLUCOSE 345* 356* 261* 266*   Electrolytes  Recent Labs Lab 12/15/14 1731 12/16/14 0245 12/17/14 0230  CALCIUM 8.9 8.1* 7.9*  MG  --  1.6* 1.6*  PHOS  --  2.5  --    Sepsis Markers No results for input(s): LATICACIDVEN, PROCALCITON, O2SATVEN in the last 168 hours. ABG  Recent Labs Lab 12/15/14 2248  PHART 7.431  PCO2ART 48.3*  PO2ART 374.0*   Liver Enzymes  Recent Labs Lab 12/15/14 1731  AST 36  ALT 43  ALKPHOS 162*  BILITOT 0.4  ALBUMIN 2.1*   Cardiac Enzymes  Recent Labs Lab 12/16/14 0407  TROPONINI 0.05*   Glucose  Recent Labs Lab 12/16/14 1115 12/16/14 1531 12/16/14 1918 12/16/14 2307 12/17/14 0421 12/17/14 0747  GLUCAP 246* 229* 282* 274* 250* 289*  Imaging Ct Head Wo Contrast  12/15/2014   CLINICAL DATA:  Patient with right-sided weakness. History of lung and liver cancer. Possible seizure.  EXAM: CT HEAD WITHOUT CONTRAST  TECHNIQUE: Contiguous axial images were obtained from the base of the skull through the vertex without intravenous contrast.  COMPARISON:  PET-CT 12/04/2014; brain CT 02/03/2013  FINDINGS: There is a 0.8 cm dense mass within the right cerebellar hemisphere (image 8; series 2) with surrounding edema. Periventricular and subcortical white matter hypodensity compatible with chronic small vessel ischemic changes. Age-indeterminate patchy hypodensities within the right thalamus. Age-indeterminate hypodensity within the right frontal lobe white matter. No evidence for significant intracranial hemorrhage or mass effect. Orbits are unremarkable. Fluid within the right maxillary sinus. Small amount of fluid within the left  sphenoid sinus. Left calvarial postoperative changes. Right frontal burr hole.  IMPRESSION: Interval development of an 8 mm mass within the right cerebral hemisphere with surrounding edema, concerning for metastasis.  Patchy hypodensities within the right thalamus are nonspecific however age-indeterminate infarct is not excluded.  Age-indeterminate hypodensities within the right frontal lobe white matter which may represent an age indeterminate infarct or potentially from prior intracranial device as there is an overlying burr hole in this location.  Critical Value/emergent results were called by telephone at the time of interpretation on 12/15/2014 at 5:51 pm to Dr. Doy Mince, who verbally acknowledged these results.   Electronically Signed   By: Lovey Newcomer M.D.   On: 12/15/2014 17:55   Dg Chest Port 1 View  12/17/2014   CLINICAL DATA:  Acute respiratory failure with hypoxemia. Initial encounter.  EXAM: PORTABLE CHEST - 1 VIEW  COMPARISON:  PET-CT 12/04/2014.  Radiograph 12/15/2014.  FINDINGS: RIGHT IJ Port-A-Cath unchanged. RIGHT hilar mass also appears similar. Enteric tube is present with the tip not visualized. Endotracheal tube tip is 4 cm from the carina. Monitoring leads project over the chest. Cardiopericardial silhouette unchanged.  The airspace disease in the RIGHT lung has improved compared to the most recent prior. There is persistent opacity at the RIGHT cardiophrenic angle which may represent atelectasis, pneumonia or asymmetric pulmonary edema. Given the hilar mass, postobstructive pneumonia is a definite consideration.  IMPRESSION: 1. Support apparatus in good position. 2. Improved RIGHT lung airspace disease with small focus of airspace opacity at the RIGHT cardiophrenic angle. 3. RIGHT hilar mass better seen with improvement in airspace disease.   Electronically Signed   By: Dereck Ligas M.D.   On: 12/17/2014 07:24   Dg Chest Portable 1 View  12/15/2014   CLINICAL DATA:  Endotracheal tube  placement.  Initial encounter.  EXAM: PORTABLE CHEST - 1 VIEW  COMPARISON:  Chest radiograph performed earlier today at 7:12 p.m.  FINDINGS: The patient's endotracheal tube is seen ending 4 cm above the carina. An enteric tube is noted extending below the diaphragm. A right-sided chest port is seen ending about the mid SVC.  There is right-sided volume loss, with mild rightward mediastinal shift. Patchy right-sided airspace opacity is more prominent peripherally, raising concern for pneumonia. No definite pleural effusion or pneumothorax is seen.  The cardiomediastinal silhouette is borderline normal in size. No acute osseous abnormalities are seen.  IMPRESSION: 1. Endotracheal tube seen ending 4 cm above the carina. 2. New right-sided volume loss, with patchy right-sided airspace opacity, which may reflect pneumonia,   Electronically Signed   By: Garald Balding M.D.   On: 12/15/2014 22:03   Dg Chest Portable 1 View  12/15/2014   CLINICAL DATA:  Pneumonia  EXAM: PORTABLE CHEST - 1 VIEW  COMPARISON:  None. Patient's prior x-ray from 2002 is not available for comparison.  FINDINGS: The heart size is enlarged. The aorta is tortuous. A right central venous line is identified with distal tip in superior vena cava. There is no focal infiltrate, pulmonary edema, or pleural effusion. No acute abnormalities identified within the visualized bones. The visualized skeletal structures are unremarkable.  IMPRESSION: No active cardiopulmonary disease.   Electronically Signed   By: Abelardo Diesel M.D.   On: 12/15/2014 19:31    EKG: NSR CXR 8/21 reviewed>  R lung mass, ETT in place  ASSESSMENT / PLAN:  Principal Problem:   Seizures Active Problems:   Metastasis to brain   Primary cancer of right lower lobe of lung   Seizure   Hypertension   Hyponatremia   COPD (chronic obstructive pulmonary disease)   Leukocytosis   Anemia   Hyperglycemia   Acute respiratory failure with hypoxemia   PULMONARY A: Stage IV  adenocarcinoma of the lung Need for mechanical ventilation due to heavy sedation> now resolved COPD, not in exacerbation Right lung infiltrate? 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 P:   Monitor BMET and UOP Replace electrolytes as needed   GASTROINTESTINAL A: Liver mets Nutrition needs P:   Hold tube feedings for now OG tube SUP> pantoprazole  HEMATOLOGIC A: Leukocytosis improving P:   Monitor   INFECTIOUS A: R lung infiltrate but does not have evidence of pneumonia P:   Monitor for fever  ENDOCRINE A: On dexamethasone for cerebral edema reduction Hyperglycemia P:   Dexamethasone per neurology Continue SSI  NEUROLOGIC A: Status epilepticus P:   Apprecaite neuro recs Keppra, Vimpat> per neuro Stop propofol  BEST PRACTICE / DISPOSITION Level of Care:  ICU Primary Service:  PCCM Consultants:  Neurology Code Status:  Full Diet:  NPO DVT Px:  Heparin GI Px:  None Skin Integrity:  Intact Social / Family:  Updated on admission  TODAY'S SUMMARY: 69 y/o man with stage IV lung adeno CA admitted with status epilepticus, likely related to brain mets > now following commands, wean sedation per neurology  I have personally obtained a history, examined the patient, evaluated laboratory and imaging results, formulated the assessment and plan and placed orders.  CRITICAL CARE: The patient is critically ill with multiple organ systems failure and requires high complexity decision making for assessment and support, frequent evaluation and titration of therapies, application of advanced monitoring technologies and extensive interpretation of multiple databases. Critical Care Time devoted to patient care services described in this note is 45 minutes (adittional by [facility] 8/20 AM)  Roselie Awkward, MD [place] [facility] Pager: [phone] Cell: [phone] After 3pm or if no response, call [phone]    12/17/2014, 11:33 AM

## 2014-12-17 NOTE — Progress Notes (Signed)
[facility] ADULT ICU REPLACEMENT PROTOCOL FOR AM LAB REPLACEMENT ONLY  The patient does apply for the [facility] Adult ICU Electrolyte Replacment Protocol based on the criteria listed below:   1. Is GFR >/= 40 ml/min? Yes.    Patient's GFR today is >60 2. Is urine output >/= 0.5 ml/kg/hr for the last 6 hours? Yes.   Patient's UOP is 1.1 ml/kg/hr 3. Is BUN < 60 mg/dL? Yes.    Patient's BUN today is 20 4. Abnormal electrolyte(s): Magnesium, 1.6 5. Ordered repletion with: [facility] adult ICU replacement protocol 6. If a panic level lab has been reported, has the CCM MD in charge been notified? Yes.  .   Physician:  Dr. Brand Males  [facility], Darrick Huntsman E 12/17/2014 5:47 AM

## 2014-12-17 NOTE — Progress Notes (Signed)
Utilization Review Completed.Wylma Tatem T8/21/2016  

## 2014-12-17 NOTE — Progress Notes (Signed)
Subjective: Patient awake and alert.  Remains intubated.  On Vimpat and Keppra.    Objective: Current vital signs: BP 150/57 mmHg  Pulse 68  Temp(Src) 98.4 F (36.9 C) (Core (Comment))  Resp 18  Ht '6\' 1"'$  (1.854 m)  Wt 80.5 kg (177 lb 7.5 oz)  BMI 23.42 kg/m2  SpO2 96% Vital signs in last 24 hours: Temp:  [97 F (36.1 C)-99 F (37.2 C)] 98.4 F (36.9 C) (08/21 0836) Pulse Rate:  [55-71] 68 (08/21 0836) Resp:  [18-20] 18 (08/21 0836) BP: (112-191)/(49-64) 150/57 mmHg (08/21 0836) SpO2:  [96 %-100 %] 96 % (08/21 0836) FiO2 (%):  [30 %-40 %] 30 % (08/21 0836) Weight:  [80.5 kg (177 lb 7.5 oz)] 80.5 kg (177 lb 7.5 oz) (08/21 0400)  Intake/Output from previous day: 08/20 0701 - 08/21 0700 In: 3742.5 [I.V.:2358.8; NG/GT:903.8; IV Piggyback:480] Out: 2870 [Urine:2870] Intake/Output this shift:   Nutritional status: Diet NPO time specified  Neurologic Exam: Mental Status: Alert.  Intubated so speech can not be evaluated but follows simple and 3-step commands.   Cranial Nerves: II: Discs flat bilaterally; Visual fields grossly normal, pupils equal, round, reactive to light and accommodation III,IV, VI: ptosis not present, extra-ocular motions intact bilaterally V,VII: facial light touch sensation normal bilaterally VIII: hearing normal bilaterally IX,X: gag reflex present XI: bilateral shoulder shrug XII: midline tongue extension Motor: Moves all extremities against gravity. Sensory: Pinprick and light touch intact throughout, bilaterally    Lab Results: Basic Metabolic Panel:  Recent Labs Lab 12/15/14 1731 12/15/14 1734 12/16/14 0245 12/17/14 0230  NA 127* 125* 127* 130*  K 4.3 4.2 4.1 4.6  CL 87* 87* 91* 97*  CO2 25  --  27 23  GLUCOSE 345* 356* 261* 266*  BUN 21* 26* 16 20  CREATININE 0.81 0.70 0.59* 0.62  CALCIUM 8.9  --  8.1* 7.9*  MG  --   --  1.6* 1.6*  PHOS  --   --  2.5  --     Liver Function Tests:  Recent Labs Lab 12/15/14 1731  AST 36   ALT 43  ALKPHOS 162*  BILITOT 0.4  PROT 6.1*  ALBUMIN 2.1*   No results for input(s): LIPASE, AMYLASE in the last 168 hours. No results for input(s): AMMONIA in the last 168 hours.  CBC:  Recent Labs Lab 12/15/14 1731 12/15/14 1734 12/16/14 0245 12/16/14 0407 12/16/14 1225  WBC 17.1*  --  13.4*  --  12.2*  NEUTROABS 15.0*  --   --   --  11.0*  HGB 9.3* 10.5* 7.5* 8.2* 7.6*  HCT 28.2* 31.0* 23.2* 25.4* 23.2*  MCV 75.4*  --  76.3*  --  75.1*  PLT 339  --  287  --  303    Cardiac Enzymes:  Recent Labs Lab 12/16/14 0407  TROPONINI 0.05*    Lipid Panel:  Recent Labs Lab 12/16/14 0407 12/17/14 0230  TRIG 48 157*    CBG:  Recent Labs Lab 12/16/14 1531 12/16/14 1918 12/16/14 2307 12/17/14 0421 12/17/14 0747  GLUCAP 229* 282* 274* 250* 54*    Microbiology: Results for orders placed or performed during the hospital encounter of 12/15/14  MRSA PCR Screening     Status: None   Collection Time: 12/16/14 12:13 AM  Result Value Ref Range Status   MRSA by PCR NEGATIVE NEGATIVE Final    Comment:        The GeneXpert MRSA Assay (FDA approved for NASAL specimens only), is one component  of a comprehensive MRSA colonization surveillance program. It is not intended to diagnose MRSA infection nor to guide or monitor treatment for MRSA infections.     Coagulation Studies:  Recent Labs  12/15/14 1731  LABPROT 14.8  INR 1.14    Imaging: Ct Head Wo Contrast  12/15/2014   CLINICAL DATA:  Patient with right-sided weakness. History of lung and liver cancer. Possible seizure.  EXAM: CT HEAD WITHOUT CONTRAST  TECHNIQUE: Contiguous axial images were obtained from the base of the skull through the vertex without intravenous contrast.  COMPARISON:  PET-CT 12/04/2014; brain CT 02/03/2013  FINDINGS: There is a 0.8 cm dense mass within the right cerebellar hemisphere (image 8; series 2) with surrounding edema. Periventricular and subcortical white matter hypodensity  compatible with chronic small vessel ischemic changes. Age-indeterminate patchy hypodensities within the right thalamus. Age-indeterminate hypodensity within the right frontal lobe white matter. No evidence for significant intracranial hemorrhage or mass effect. Orbits are unremarkable. Fluid within the right maxillary sinus. Small amount of fluid within the left sphenoid sinus. Left calvarial postoperative changes. Right frontal burr hole.  IMPRESSION: Interval development of an 8 mm mass within the right cerebral hemisphere with surrounding edema, concerning for metastasis.  Patchy hypodensities within the right thalamus are nonspecific however age-indeterminate infarct is not excluded.  Age-indeterminate hypodensities within the right frontal lobe white matter which may represent an age indeterminate infarct or potentially from prior intracranial device as there is an overlying burr hole in this location.  Critical Value/emergent results were called by telephone at the time of interpretation on 12/15/2014 at 5:51 pm to Dr. Doy Mince, who verbally acknowledged these results.   Electronically Signed   By: Lovey Newcomer M.D.   On: 12/15/2014 17:55   Dg Chest Port 1 View  12/17/2014   CLINICAL DATA:  Acute respiratory failure with hypoxemia. Initial encounter.  EXAM: PORTABLE CHEST - 1 VIEW  COMPARISON:  PET-CT 12/04/2014.  Radiograph 12/15/2014.  FINDINGS: RIGHT IJ Port-A-Cath unchanged. RIGHT hilar mass also appears similar. Enteric tube is present with the tip not visualized. Endotracheal tube tip is 4 cm from the carina. Monitoring leads project over the chest. Cardiopericardial silhouette unchanged.  The airspace disease in the RIGHT lung has improved compared to the most recent prior. There is persistent opacity at the RIGHT cardiophrenic angle which may represent atelectasis, pneumonia or asymmetric pulmonary edema. Given the hilar mass, postobstructive pneumonia is a definite consideration.  IMPRESSION: 1.  Support apparatus in good position. 2. Improved RIGHT lung airspace disease with small focus of airspace opacity at the RIGHT cardiophrenic angle. 3. RIGHT hilar mass better seen with improvement in airspace disease.   Electronically Signed   By: Dereck Ligas M.D.   On: 12/17/2014 07:24   Dg Chest Portable 1 View  12/15/2014   CLINICAL DATA:  Endotracheal tube placement.  Initial encounter.  EXAM: PORTABLE CHEST - 1 VIEW  COMPARISON:  Chest radiograph performed earlier today at 7:12 p.m.  FINDINGS: The patient's endotracheal tube is seen ending 4 cm above the carina. An enteric tube is noted extending below the diaphragm. A right-sided chest port is seen ending about the mid SVC.  There is right-sided volume loss, with mild rightward mediastinal shift. Patchy right-sided airspace opacity is more prominent peripherally, raising concern for pneumonia. No definite pleural effusion or pneumothorax is seen.  The cardiomediastinal silhouette is borderline normal in size. No acute osseous abnormalities are seen.  IMPRESSION: 1. Endotracheal tube seen ending 4 cm above the carina.  2. New right-sided volume loss, with patchy right-sided airspace opacity, which may reflect pneumonia,   Electronically Signed   By: Garald Balding M.D.   On: 12/15/2014 22:03   Dg Chest Portable 1 View  12/15/2014   CLINICAL DATA:  Pneumonia  EXAM: PORTABLE CHEST - 1 VIEW  COMPARISON:  None. Patient's prior x-ray from 2002 is not available for comparison.  FINDINGS: The heart size is enlarged. The aorta is tortuous. A right central venous line is identified with distal tip in superior vena cava. There is no focal infiltrate, pulmonary edema, or pleural effusion. No acute abnormalities identified within the visualized bones. The visualized skeletal structures are unremarkable.  IMPRESSION: No active cardiopulmonary disease.   Electronically Signed   By: Abelardo Diesel M.D.   On: 12/15/2014 19:31    Medications:  I have reviewed the  patient's current medications. Scheduled: . antiseptic oral rinse  7 mL Mouth Rinse QID  . chlorhexidine gluconate  15 mL Mouth Rinse BID  . clopidogrel  75 mg Oral Daily  . dexamethasone  5 mg Intravenous 4 times per day  . feeding supplement (PRO-STAT SUGAR FREE 64)  30 mL Per Tube TID  . heparin  5,000 Units Subcutaneous 3 times per day  . insulin aspart  0-9 Units Subcutaneous 6 times per day  . lacosamide (VIMPAT) IV  50 mg Intravenous Q12H  . levETIRAcetam  1,000 mg Intravenous Q12H  . pantoprazole (PROTONIX) IV  40 mg Intravenous Q24H    Assessment/Plan: No further seizures noted. Remains on Vimpat and Keppra.  Can likely remain on Keppra monotherapy.  Will start to taper Vimpat. Hyponatremia improving.    Recommendations: 1.  Vimpat '50mg'$  qday 2.  Continue Keppra at '1000mg'$  BID.  Would change to po once patient able to take po 3.  Continue seizure precautions 4.  Patient unable to drive, operate heavy machinery, perform activities at heights and participate in water activities until release by outpatient physician.  This was discussed with family and patient.     LOS: 2 days   Alexis Goodell, MD [facility] [phone] 12/17/2014  9:33 AM

## 2014-12-17 NOTE — Progress Notes (Signed)
VASCULAR LAB PRELIMINARY  PRELIMINARY  PRELIMINARY  PRELIMINARY  Bilateral lower extremity venous duplex completed.    Preliminary report:  There is no obvious evidence of DVT or SVT noted in the bilateral lower extremities.   Ariyonna Twichell, RVT 12/17/2014, 11:17 AM

## 2014-12-17 NOTE — Procedures (Signed)
Extubation Procedure Note  Patient Details:   Name: Frank Krueger DOB: [birth year] MRN: [id number]   Airway Documentation:     Evaluation  O2 sats: stable throughout Complications: No apparent complications Patient did tolerate procedure well. Bilateral Breath Sounds: Rhonchi Suctioning: Airway Yes. Extubated per dr. Anastasia Pall orders.  Placed on 2L [place]  Frank Krueger V 12/17/2014, 11:47 AM 2

## 2014-12-18 LAB — CBC WITH DIFFERENTIAL/PLATELET
Basophils Absolute: 0 10*3/uL (ref 0.0–0.1)
Basophils Relative: 0 % (ref 0–1)
Eosinophils Absolute: 0 10*3/uL (ref 0.0–0.7)
Eosinophils Relative: 0 % (ref 0–5)
HEMATOCRIT: 28.3 % — AB (ref 39.0–52.0)
Hemoglobin: 9.2 g/dL — ABNORMAL LOW (ref 13.0–17.0)
LYMPHS ABS: 0.8 10*3/uL (ref 0.7–4.0)
LYMPHS PCT: 5 % — AB (ref 12–46)
MCH: 24.9 pg — AB (ref 26.0–34.0)
MCHC: 32.5 g/dL (ref 30.0–36.0)
MCV: 76.5 fL — AB (ref 78.0–100.0)
MONO ABS: 0.8 10*3/uL (ref 0.1–1.0)
MONOS PCT: 5 % (ref 3–12)
NEUTROS ABS: 13.8 10*3/uL — AB (ref 1.7–7.7)
Neutrophils Relative %: 90 % — ABNORMAL HIGH (ref 43–77)
Platelets: 319 10*3/uL (ref 150–400)
RBC: 3.7 MIL/uL — ABNORMAL LOW (ref 4.22–5.81)
RDW: 20.1 % — AB (ref 11.5–15.5)
WBC: 15.4 10*3/uL — ABNORMAL HIGH (ref 4.0–10.5)

## 2014-12-18 LAB — BASIC METABOLIC PANEL
ANION GAP: 9 (ref 5–15)
BUN: 16 mg/dL (ref 6–20)
CALCIUM: 8.1 mg/dL — AB (ref 8.9–10.3)
CHLORIDE: 99 mmol/L — AB (ref 101–111)
CO2: 27 mmol/L (ref 22–32)
Creatinine, Ser: 0.64 mg/dL (ref 0.61–1.24)
GFR calc Af Amer: >60 mL/min (ref >60)
GFR calc non Af Amer: >60 mL/min (ref >60)
GLUCOSE: 156 mg/dL — AB (ref 65–99)
POTASSIUM: 3.8 mmol/L (ref 3.5–5.1)
Sodium: 135 mmol/L (ref 135–145)

## 2014-12-18 LAB — HEMOGLOBIN A1C
Hgb A1c MFr Bld: 7.7 % — ABNORMAL HIGH (ref 4.8–5.6)
Mean Plasma Glucose: 174 mg/dL

## 2014-12-18 LAB — GLUCOSE, CAPILLARY
GLUCOSE-CAPILLARY: 151 mg/dL — AB (ref 65–99)
Glucose-Capillary: 149 mg/dL — ABNORMAL HIGH (ref 65–99)
Glucose-Capillary: 154 mg/dL — ABNORMAL HIGH (ref 65–99)
Glucose-Capillary: 195 mg/dL — ABNORMAL HIGH (ref 65–99)
Glucose-Capillary: 356 mg/dL — ABNORMAL HIGH (ref 65–99)

## 2014-12-18 MED ORDER — SODIUM CHLORIDE 0.9 % IV SOLN
1500.0000 mg | Freq: Two times a day (BID) | INTRAVENOUS | Status: DC
  Administered 2014-12-18 – 2014-12-20 (×4): 1500 mg via INTRAVENOUS
  Filled 2014-12-18 (×7): qty 15

## 2014-12-18 MED ORDER — PANTOPRAZOLE SODIUM 40 MG PO TBEC
40.0000 mg | DELAYED_RELEASE_TABLET | Freq: Every day | ORAL | Status: DC
  Administered 2014-12-18 – 2014-12-19 (×2): 40 mg via ORAL
  Filled 2014-12-18: qty 1

## 2014-12-18 MED ORDER — RESOURCE THICKENUP CLEAR PO POWD
ORAL | Status: DC | PRN
  Filled 2014-12-18: qty 125
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
## 2014-12-18 NOTE — Evaluation (Signed)
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 PMH includes TBI ~10 years ago, cerebral aneurysm rupture, COPD.   Clinical Impression  Patient demonstrates deficits in functional mobility as indicated below. Will need continued skilled PT to address deficits and maximize function. Will see as indicated and progress as tolerated. At this time, feel patient will need continued skilled PT upon acute discharge as patient will need to reach levels of independence with decreased caregiver support. Anticipate patient will need ST SNF. Will follow to faciliate d/c plan.    Follow Up Recommendations SNF;Supervision/Assistance - 24 hour    Equipment Recommendations   (TBD)    Recommendations for Other Services OT consult     Precautions / Restrictions Precautions Precautions: Fall Restrictions Weight Bearing Restrictions: No      Mobility  Bed Mobility               General bed mobility comments: received in recliner  Transfers Overall transfer level: Needs assistance Equipment used: 2 person hand held assist Transfers: Sit to/from Stand Sit to Stand: Min assist         General transfer comment: VCs for hand placement and safety, min assist for stability in elevating to standing  Ambulation/Gait Ambulation/Gait assistance: Mod assist Ambulation Distance (Feet): 18 Feet Assistive device: 2 person hand held assist Gait Pattern/deviations: Decreased stride length;Decreased dorsiflexion - left;Steppage;Ataxic;Staggering left;Staggering right;Trunk flexed;Narrow base of support Gait velocity: decreased Gait velocity interpretation: <1.8 ft/sec, indicative of risk for recurrent falls General Gait Details: patientsignificantly unsteady with ambulation, required 2 person  assist to maintain balance and upright, patient with difficulty coordinating steps and ocasional deficits in LLE clearance. Patient also unsafe and impulsive with mobility  Stairs            Wheelchair Mobility    Modified Rankin (Stroke Patients Only)       Balance Overall balance assessment: Needs assistance Sitting-balance support: Feet supported Sitting balance-Leahy Scale: Fair     Standing balance support: Bilateral upper extremity supported;During functional activity Standing balance-Leahy Scale: Poor Standing balance comment: heavy reliance on support for stability in standing, flexed posture                             Pertinent Vitals/Pain Pain Assessment: No/denies pain    Home Living Family/patient expects to be discharged to:: Private residence Living Arrangements: Alone Available Help at Discharge: Family;Available PRN/intermittently (not able to provide 24/7) Type of Home: House Home Access: Level entry     Home Layout: One level Home Equipment: Cane - single point;Shower seat;Adaptive equipment;Transport chair      Prior Function Level of Independence: Independent               Hand Dominance   Dominant Hand: Right    Extremity/Trunk Assessment   Upper Extremity Assessment: Defer to OT evaluation           Lower Extremity Assessment: Difficult to assess due to impaired cognition      Cervical / Trunk Assessment: Kyphotic  Communication   Communication: HOH  Cognition Arousal/Alertness: Awake/alert Behavior During Therapy: Impulsive Overall Cognitive Status: Impaired/Different from baseline Area of Impairment: Orientation;Attention;Memory;Following commands;Safety/judgement;Awareness;Problem solving Orientation Level: Disoriented to;Place;Time;Situation Current Attention Level: Focused Memory: Decreased short-term memory Following Commands: Follows one  step commands inconsistently;Follows one step commands with  increased time Safety/Judgement: Decreased awareness of safety;Decreased awareness of deficits Awareness: Intellectual Problem Solving: Slow processing;Decreased initiation;Difficulty sequencing;Requires verbal cues;Requires tactile cues General Comments: Patient significantly confused, with poor insight and awareness.     General Comments      Exercises        Assessment/Plan    PT Assessment Patient needs continued PT services  PT Diagnosis Difficulty walking;Abnormality of gait;Altered mental status   PT Problem List Decreased strength;Decreased activity tolerance;Decreased balance;Decreased mobility;Decreased coordination;Decreased cognition;Decreased knowledge of use of DME;Decreased safety awareness  PT Treatment Interventions DME instruction;Gait training;Functional mobility training;Therapeutic activities;Therapeutic exercise;Balance training;Cognitive remediation;Patient/family education   PT Goals (Current goals can be found in the Care Plan section) Acute Rehab PT Goals Patient Stated Goal: per daughter goal is for independence prior to home PT Goal Formulation: With patient/family Time For Goal Achievement: 01/01/15 Potential to Achieve Goals: Fair    Frequency Min 3X/week   Barriers to discharge Decreased caregiver support      Co-evaluation               End of Session Equipment Utilized During Treatment: Gait belt Activity Tolerance: Patient tolerated treatment well Patient left: in chair;with call bell/phone within reach;with family/visitor present;with restraints reapplied (posey applied) Nurse Communication: Mobility status         Time: 7615-1834 PT Time Calculation (min) (ACUTE ONLY): 25 min   Charges:   PT Evaluation $Initial PT Evaluation Tier I: 1 Procedure PT Treatments $Therapeutic Activity: 8-22 mins   PT G CodesDuncan Dull Jan 13, 2015, 5:46 PM Alben Deeds, [place] DPT  [phone]

## 2014-12-18 NOTE — Progress Notes (Signed)
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   SIGNIFICANT EVENTS / STUDIES:  Intubated in ED after being dosed with increasing doses of sedatives  LINES / TUBES: ETT 8/20> 8/21 NGT 8/20> Foley 8/20>  CULTURES: None  ANTIBIOTICS: None  SUBJECTIVE: Extubated on 8/21. Stable.   VITAL SIGNS: Temp:  [98.2 F (36.8 C)-99.5 F (37.5 C)] 99.3 F (37.4 C) (08/22 0900) Pulse Rate:  [70-94] 87 (08/22 0900) Resp:  [13-26] 18 (08/22 0900) BP: (114-147)/(57-91) 144/72 mmHg (08/22 0900) SpO2:  [91 %-100 %] 98 % (08/22 0900) FiO2 (%):  [30 %] 30 % (08/21 1030) Weight:  [174 lb 6.1 oz (79.1 kg)] 174 lb 6.1 oz (79.1 kg) (08/22 0500) HEMODYNAMICS:   VENTILATOR SETTINGS: Vent Mode:  [-]  FiO2 (%):  [30 %] 30 % INTAKE / OUTPUT: Intake/Output      08/21 0701 - 08/22 0700 08/22 0701 - 08/23 0700   I.V. (mL/kg) 1755.5 (22.2)    NG/GT 195    IV Piggyback 140    Total Intake(mL/kg) 2090.5 (26.4)    Urine (mL/kg/hr) 4255 (2.2)    Stool 0 (0)    Total Output 4255     Net -2164.5          Stool Occurrence 1 x      PHYSICAL EXAMINATION: General:  Awake, slightly confused. HENT: Moist mucus membranes PULM: Clear antr CV: RRR, No MRG GI: BS+, soft, nontender  LABS:  CBC  Recent Labs Lab 12/16/14 0245 12/16/14 0407 12/16/14 1225 12/18/14 0241  WBC 13.4*  --  12.2* 15.4*  HGB 7.5* 8.2* 7.6* 9.2*  HCT 23.2* 25.4* 23.2* 28.3*  PLT 287  --  303 319   Coag's  Recent Labs Lab 12/15/14 1731  APTT 27  INR 1.14   BMET  Recent Labs Lab 12/16/14 0245 12/17/14 0230 12/18/14 0241  NA 127* 130* 135  K 4.1 4.6 3.8  CL 91* 97* 99*  CO2 '27 23 27  '$ BUN '16 20 16  '$ CREATININE 0.59*  0.62 0.64  GLUCOSE 261* 266* 156*   Electrolytes  Recent Labs Lab 12/16/14 0245 12/17/14 0230 12/18/14 0241  CALCIUM 8.1* 7.9* 8.1*  MG 1.6* 1.6*  --   PHOS 2.5  --   --    Sepsis Markers No results for input(s): LATICACIDVEN, PROCALCITON, O2SATVEN in the last 168 hours. ABG  Recent Labs Lab 12/15/14 2248  PHART 7.431  PCO2ART 48.3*  PO2ART 374.0*   Liver Enzymes  Recent Labs Lab 12/15/14 1731  AST 36  ALT 43  ALKPHOS 162*  BILITOT 0.4  ALBUMIN 2.1*   Cardiac Enzymes  Recent Labs Lab 12/16/14 0407  TROPONINI 0.05*   Glucose  Recent Labs Lab 12/17/14 1128 12/17/14 1558 12/17/14 1927 12/17/14 2306 12/18/14 0320 12/18/14 0803  GLUCAP 250* 110* 130* 110* 149* 154*    Imaging Dg Chest Port 1 View  12/17/2014   CLINICAL DATA:  Acute respiratory failure with hypoxemia. Initial encounter.  EXAM: PORTABLE CHEST - 1 VIEW  COMPARISON:  PET-CT 12/04/2014.  Radiograph 12/15/2014.  FINDINGS: RIGHT IJ Port-A-Cath unchanged. RIGHT hilar mass also appears similar. Enteric tube is present with the tip not visualized. Endotracheal tube tip is 4 cm from  the carina. Monitoring leads project over the chest. Cardiopericardial silhouette unchanged.  The airspace disease in the RIGHT lung has improved compared to the most recent prior. There is persistent opacity at the RIGHT cardiophrenic angle which may represent atelectasis, pneumonia or asymmetric pulmonary edema. Given the hilar mass, postobstructive pneumonia is a definite consideration.  IMPRESSION: 1. Support apparatus in good position. 2. Improved RIGHT lung airspace disease with small focus of airspace opacity at the RIGHT cardiophrenic angle. 3. RIGHT hilar mass better seen with improvement in airspace disease.   Electronically Signed   By: Dereck Ligas M.D.   On: 12/17/2014 07:24    EKG: NSR CXR 8/22 reviewed>  R lung mass, better aeration on right.  ASSESSMENT / PLAN:  Principal Problem:   Seizures Active  Problems:   Metastasis to brain   Primary cancer of right lower lobe of lung   Seizure   Hypertension   Hyponatremia   COPD (chronic obstructive pulmonary disease)   Leukocytosis   Anemia   Hyperglycemia   Acute respiratory failure with hypoxemia  PULMONARY A: Stage IV adenocarcinoma of the lung Need for mechanical ventilation due to heavy sedation> now resolved COPD, not in exacerbation Right lung infiltrate? P:   Wean down O2 as tolerated  CARDIOVASCULAR A: R leg swelling. 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 P:   Monitor BMET and UOP Replace electrolytes as needed   GASTROINTESTINAL A: Liver mets Nutrition needs P:   Speech eval today Resume PO diet when cleared.  HEMATOLOGIC A: Leukocytosis Slightly worse today. P:   Monitor   INFECTIOUS A: R lung infiltrate but does not have evidence of pneumonia P:   Monitor for fever  ENDOCRINE A: On dexamethasone for cerebral edema reduction Hyperglycemia P:   Dexamethasone per neurology Continue SSI  NEUROLOGIC A: Status epilepticus P:   Apprecaite neuro recs Keppra, Vimpat> per neuro   BEST PRACTICE / DISPOSITION Level of Care:  ICU Primary Service:  PCCM Consultants:  Neurology Code Status:  Full Diet:  NPO DVT Px:  Heparin GI Px:  None Skin Integrity:  Intact Social / Family:  Updated on admission  TODAY'S SUMMARY: 69 y/o man with stage IV lung adeno CA admitted with status epilepticus, likely related to brain mets > now following commands. Extbated over the weekend. Stable  Pt is not critically ill.  Marshell Garfinkel MD [facility] Pager [phone] If no answer or after 3pm call: [phone] 12/18/2014, 10:18 AM

## 2014-12-18 NOTE — Progress Notes (Signed)
Subjective: Much improved, though still with some aphasia  Exam: Filed Vitals:   12/18/14 0900  BP: 144/72  Pulse: 87  Temp: 99.3 F (37.4 C)  Resp: 18   Gen: In bed, NAD Resp: non-labored breathing, no acute distress Abd: soft, nt  Neuro: MS: Awake, alert, follows some commands, able to tell me his name PE:TKKO, appears to see in the right visual field, but difficult to be certain as patient will not either blink to threat from either direction or stop fixating anything in the right field in order to test the periphery.  Motor: moves all extremities well.  Sensory:responds to minor stim throughout.   Impression: 69 yo M with seizures in the setting of brain metastasis. His continued improvement is reassuring. The '50mg'$  daily of vimpat I do not think is contributing much and I would favor maximizing one AED before adding a second. Prolonged post-ictal deficits can be seen after seizures associated with mass lesions.    Recommendations: 1) discontinue vimpat 2) Increase keppra to '1500mg'$  BID 3) will continue to follow.   Roland Rack, MD [facility] [phone]  If 7pm- 7am, please page neurology on call as listed in [place].

## 2014-12-19 DIAGNOSIS — D72829 Elevated white blood cell count, unspecified: Secondary | ICD-10-CM

## 2014-12-19 DIAGNOSIS — R739 Hyperglycemia, unspecified: Secondary | ICD-10-CM

## 2014-12-19 LAB — GLUCOSE, CAPILLARY
GLUCOSE-CAPILLARY: 224 mg/dL — AB (ref 65–99)
GLUCOSE-CAPILLARY: 180 mg/dL — AB (ref 65–99)
GLUCOSE-CAPILLARY: 416 mg/dL — AB (ref 65–99)
Glucose-Capillary: 246 mg/dL — ABNORMAL HIGH (ref 65–99)
Glucose-Capillary: 370 mg/dL — ABNORMAL HIGH (ref 65–99)
Glucose-Capillary: 410 mg/dL — ABNORMAL HIGH (ref 65–99)
Glucose-Capillary: 348 mg/dL — ABNORMAL HIGH (ref 65–99)
Glucose-Capillary: 326 mg/dL — ABNORMAL HIGH (ref 65–99)

## 2014-12-19 MED ORDER — INSULIN ASPART 100 UNIT/ML ~~LOC~~ SOLN
0.0000 [IU] | Freq: Three times a day (TID) | SUBCUTANEOUS | Status: DC
  Administered 2014-12-19: 15 [IU] via SUBCUTANEOUS
  Administered 2014-12-20: 8 [IU] via SUBCUTANEOUS
  Administered 2014-12-20: 11 [IU] via SUBCUTANEOUS

## 2014-12-19 MED ORDER — GLUCERNA SHAKE PO LIQD
237.0000 mL | Freq: Three times a day (TID) | ORAL | Status: DC
  Administered 2014-12-19 – 2014-12-20 (×3): 237 mL via ORAL

## 2014-12-19 MED ORDER — INSULIN ASPART 100 UNIT/ML ~~LOC~~ SOLN
18.0000 [IU] | Freq: Once | SUBCUTANEOUS | Status: AC
  Administered 2014-12-19: 18 [IU] via SUBCUTANEOUS

## 2014-12-19 MED ORDER — LORAZEPAM 2 MG/ML IJ SOLN
1.0000 mg | Freq: Once | INTRAMUSCULAR | Status: AC
  Administered 2014-12-19: 1 mg via INTRAVENOUS
  Filled 2014-12-19: qty 1

## 2014-12-19 MED ORDER — INSULIN GLARGINE 100 UNIT/ML ~~LOC~~ SOLN
6.0000 [IU] | Freq: Every day | SUBCUTANEOUS | Status: DC
  Administered 2014-12-19: 6 [IU] via SUBCUTANEOUS
  Filled 2014-12-19 (×2): qty 0.06

## 2014-12-19 NOTE — Progress Notes (Signed)
Subjective: Continues to improve per daughter.  Now able to recognize people and recall events.  No aphasia noted today. No seizures after Vimpat stopped.   Exam: Filed Vitals:   12/19/14 0522  BP: 132/71  Pulse: 71  Temp: 98.5 F (36.9 C)  Resp: 18    HEENT-  Normocephalic, no lesions, without obvious abnormality.  Normal external eye and conjunctiva.  Normal TM's bilaterally.  Normal auditory canals and external ears. Normal external nose, mucus membranes and septum.  Normal pharynx. Cardiovascular- S1, S2 normal, pulses palpable throughout   Lungs- no tachypnea, retractions or cyanosis Abdomen- normal findings: bowel sounds normal Extremities- no edema     Gen: In bed, NAD MS: awake, alert, follows commands, knows his daughter and able to tell her events that have occurred. CN: EOMI, counts fingers in all visual fields. PERRLA, TML Motor: moving all extremities well Sensory: intact throughout   Pertinent Labs: none  Etta Quill PA-C [facility] [phone]  Impression:  69 yo M with seizures in the setting of brain metastasis. He has had no further seizures on Keppra..  Continues to improve daily.   Recommendations:  At this time would continue Keppra at current dose. No further recommendations. Neurology will S/O   12/19/2014, 9:21 AM

## 2014-12-19 NOTE — Progress Notes (Signed)
Speech Language Pathology Treatment: Dysphagia  Patient Details Name: Frank Krueger MRN: [id number] DOB: [birth year] Today's Date: 12/19/2014 Time: 7615-1834 SLP Time Calculation (min) (ACUTE ONLY): 19 min  Assessment / Plan / Recommendation Clinical Impression  Pt continues to have intermittent coughing with thin liquids, that is both immediately following sips and delayed. Of note, coughing does not begin until after pt has already consumed most of his cup of water. Nectar thick liquids do not elicit the same cough response. Given that pt had a brief intubation and vocal quality is strong, question if this could be attributable to possible esophageal component (daughter present endorses a h/o reflux). Would continue with nectar thick liquids with SLP f/u to assess readiness to advance versus need for MBS.    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 PMH includes TBI ~10 years ago, cerebral aneurysm rupture, COPD.   Pertinent Vitals Pain Assessment: No/denies pain  SLP Plan  Continue with current plan of care    Recommendations Diet recommendations: Dysphagia 3 (mechanical soft);Nectar-thick liquid Liquids provided via: Cup;Straw Medication Administration: Whole meds with puree Supervision: Patient able to self feed;Full supervision/cueing for compensatory strategies Compensations: Minimize environmental distractions;Slow rate;Small sips/bites Postural Changes and/or Swallow Maneuvers: Seated upright 90 degrees;Upright 30-60 min after meal       Oral Care Recommendations: Oral care BID Follow up Recommendations: Skilled Nursing facility Plan: Continue with current plan of care    Germain Osgood, M.A. CCC-SLP [phone]  Germain Osgood 12/19/2014, 4:39 PM

## 2014-12-19 NOTE — Progress Notes (Signed)
Patient Demographics  Frank Krueger, is a 69 y.o. male, DOB - 1945/10/31, RSW:[id number]  Admit date - 12/15/2014   Admitting Physician Luz Brazen, MD  Outpatient Primary MD for the patient is No primary care provider on file.  LOS - 4   Chief Complaint  Patient presents with  . Code Stroke       Admission HPI/Brief narrative: 69 year old male with stage IV lung cancer with brain metastases on radiation,, followed by [facility]ology, [facility]ology , presents with seizures, required intubation initially 8/20, successfully extubated 8/21, transferred to hospitalist service on 12/19/2014, patient  had altered mental status secondary to acute delirium, and postictal status.  Subjective:   Frank Krueger today has, No headache, No chest pain, No abdominal pain - No Nausea, No new weakness tingling or numbness, No Cough - SOB.   Assessment & Plan    Principal Problem:   Seizures Active Problems:   Metastasis to brain   Primary cancer of right lower lobe of lung   Seizure   Hypertension   Hyponatremia   COPD (chronic obstructive pulmonary disease)   Leukocytosis   Anemia   Hyperglycemia   Acute respiratory failure with hypoxemia  Acute hypoxemic respiratory failure - Continue procedures. - Resolved, currently on room air, intubated 8/20, extubated 8/21.  Seizures - Secondary to brain metastases - Continue with Keppra and Decadron   Stage IV adenocarcinoma of the lung - Following with [facility] oncology, follow-up on 12/28/14 at [facility] oncology , posterior resume chemotherapy at 12/29/14 at [facility] ,radiation oncology at [facility] (finished his treatment). - Continue with Decadron for brain metastases.  Altered mental status - Currently improved, almost back to baseline per daughter. - secondary for seizure, leukocytosis status.  Diabetes  mellitus - uncontrolled and insulin sliding scale, will add low-dose Lantus, change sliding scale from every 4 H to 3 times a day before meals - Continue to hold glipizide  Hyponatremia - Due to SIADH that at one concept - Resolved  Leukocytosis - Most likely due to Decadron, afebrile, monitor  COPD - No active wheezing, continue with nebs as needed    Code Status: Full  Family Communication: Daughter at bedside  Disposition Plan: SNF when bed is available   Procedures  Intubated 8/20, extubated 8/21   Consults   PCCM Neurology   Medications  Scheduled Meds: . clopidogrel  75 mg Oral Daily  . dexamethasone  4 mg Intravenous 3 times per day  . feeding supplement (GLUCERNA SHAKE)  237 mL Oral TID BM  . heparin  5,000 Units Subcutaneous 3 times per day  . insulin aspart  0-15 Units Subcutaneous TID WC  . insulin glargine  6 Units Subcutaneous Daily  . levETIRAcetam  1,500 mg Intravenous Q12H  . pantoprazole  40 mg Oral QHS   Continuous Infusions: . sodium chloride 75 mL/hr at 12/17/14 1000   PRN Meds:.sodium chloride, acetaminophen **OR** acetaminophen, fentaNYL (SUBLIMAZE) injection, haloperidol lactate, hydrALAZINE, LORazepam, ondansetron **OR** ondansetron (ZOFRAN) IV, RESOURCE THICKENUP CLEAR  DVT Prophylaxis  Heparin - SCDs   Lab Results  Component Value Date   PLT 319 12/18/2014    Antibiotics    Anti-infectives    None  Objective:   Filed Vitals:   12/18/14 1800 12/18/14 1952 12/19/14 0522 12/19/14 1421  BP: 132/73 133/64 132/71 134/58  Pulse: 97 94 71 82  Temp:  99.7 F (37.6 C) 98.5 F (36.9 C) 98.4 F (36.9 C)  TempSrc:  Oral Oral Oral  Resp:  '18 18 18  '$ Height:  '6\' 1"'$  (1.854 m)    Weight:  79.1 kg (174 lb 6.1 oz) 79.1 kg (174 lb 6.1 oz)   SpO2: 93% 98% 96% 93%    Wt Readings from Last 3 Encounters:  12/19/14 79.1 kg (174 lb 6.1 oz)  12/13/14 82.691 kg (182 lb 4.8 oz)  12/07/14 80.559 kg (177 lb 9.6 oz)      Intake/Output Summary (Last 24 hours) at 12/19/14 1504 Last data filed at 12/19/14 0900  Gross per 24 hour  Intake    465 ml  Output   1800 ml  Net  -1335 ml     Physical Exam  Awake Alert, pleasant, mildly confused [place].AT,PERRAL Supple Neck,No JVD, No cervical lymphadenopathy appriciated.  movement, Good air movement bilaterally,  RRR,No Gallops,Rubs or new Murmurs, No Parasternal Heave +ve B.Sounds, Abd Soft, No tenderness, No organomegaly appriciated, No rebound - guarding or rigidity. No Cyanosis, Clubbing or edema, No new Rash or bruise     Data Review   Micro Results Recent Results (from the past 240 hour(s))  MRSA PCR Screening     Status: None   Collection Time: 12/16/14 12:13 AM  Result Value Ref Range Status   MRSA by PCR NEGATIVE NEGATIVE Final    Comment:        The GeneXpert MRSA Assay (FDA approved for NASAL specimens only), is one component of a comprehensive MRSA colonization surveillance program. It is not intended to diagnose MRSA infection nor to guide or monitor treatment for MRSA infections.     Radiology Reports Ct Head Wo Contrast  12/15/2014   CLINICAL DATA:  Patient with right-sided weakness. History of lung and liver cancer. Possible seizure.  EXAM: CT HEAD WITHOUT CONTRAST  TECHNIQUE: Contiguous axial images were obtained from the base of the skull through the vertex without intravenous contrast.  COMPARISON:  PET-CT 12/04/2014; brain CT 02/03/2013  FINDINGS: There is a 0.8 cm dense mass within the right cerebellar hemisphere (image 8; series 2) with surrounding edema. Periventricular and subcortical white matter hypodensity compatible with chronic small vessel ischemic changes. Age-indeterminate patchy hypodensities within the right thalamus. Age-indeterminate hypodensity within the right frontal lobe white matter. No evidence for significant intracranial hemorrhage or mass effect. Orbits are unremarkable. Fluid within the right maxillary  sinus. Small amount of fluid within the left sphenoid sinus. Left calvarial postoperative changes. Right frontal burr hole.  IMPRESSION: Interval development of an 8 mm mass within the right cerebral hemisphere with surrounding edema, concerning for metastasis.  Patchy hypodensities within the right thalamus are nonspecific however age-indeterminate infarct is not excluded.  Age-indeterminate hypodensities within the right frontal lobe white matter which may represent an age indeterminate infarct or potentially from prior intracranial device as there is an overlying burr hole in this location.  Critical Value/emergent results were called by telephone at the time of interpretation on 12/15/2014 at 5:51 pm to Dr. Doy Mince, who verbally acknowledged these results.   Electronically Signed   By: Lovey Newcomer M.D.   On: 12/15/2014 17:55   Nm Pet Image Initial (pi) Skull Base To Thigh  12/04/2014   CLINICAL DATA:  Initial treatment strategy for lung cancer.  EXAM:  NUCLEAR MEDICINE PET SKULL BASE TO THIGH  TECHNIQUE: 9.0 mCi F-18 FDG was injected intravenously. Full-ring PET imaging was performed from the skull base to thigh after the radiotracer. CT data was obtained and used for attenuation correction and anatomic localization.  FASTING BLOOD GLUCOSE:  Value: 160 mg/dl  COMPARISON:  None.  FINDINGS: NECK  No hypermetabolic lymph nodes in the neck.  CHEST  Right supraclavicular lymph node is hypermetabolic with SUV max = 4.3 (image 54 series 4).  19 mm short axis right paratracheal lymph node on image 68 series for is hypermetabolic with SUV max = 7.2.  3.3 x 4.4 cm right hilar mass is hypermetabolic with SUV max = 7.7.  Other hypermetabolic lymphadenopathy is seen in the mediastinum,, including the prevascular space and subcarinal station. There is probably a small posterior left hypermetabolic hilar lymph node (image 78 series 4).  11 mm right lower lobe pulmonary nodule (image 26 series 6) is hypermetabolic with SUV max  = 2.0. Other scattered smaller pulmonary nodules are seen bilaterally.  Coronary artery calcification is evident.  ABDOMEN/PELVIS  Multiple hypermetabolic lesions are seen in the liver. Index lesion in the inferior right liver adjacent to the gallbladder fossa demonstrates SUV max = 7.8 (see image 130 series 4). Liver lesions appear to measure in the 1-3 cm size range.  2.0 cm right adrenal nodule is hypermetabolic with SUV max = 6.2.  There is abdominal aortic atherosclerosis without aneurysm.  SKELETON  Scattered hypermetabolic bone metastases are evident. 9 millimeter lesion in the L3 vertebral body demonstrates SUV max = 4.7. Lucent lesion in the right anterior 6th rib is hypermetabolic with SUV max = 3.2.  IMPRESSION: Markedly hypermetabolic central right lung lesion consistent with the patient's known reported history of lung cancer. This is associated with bilateral pulmonary nodules, some of the larger of which demonstrate hypermetabolism. There are also hypermetabolic metastases in the right supraclavicular region, mediastinum, left hilum, liver, right adrenal gland, and skeleton.   Electronically Signed   By: Misty Stanley M.D.   On: 12/04/2014 10:30   Dg Chest Port 1 View  12/17/2014   CLINICAL DATA:  Acute respiratory failure with hypoxemia. Initial encounter.  EXAM: PORTABLE CHEST - 1 VIEW  COMPARISON:  PET-CT 12/04/2014.  Radiograph 12/15/2014.  FINDINGS: RIGHT IJ Port-A-Cath unchanged. RIGHT hilar mass also appears similar. Enteric tube is present with the tip not visualized. Endotracheal tube tip is 4 cm from the carina. Monitoring leads project over the chest. Cardiopericardial silhouette unchanged.  The airspace disease in the RIGHT lung has improved compared to the most recent prior. There is persistent opacity at the RIGHT cardiophrenic angle which may represent atelectasis, pneumonia or asymmetric pulmonary edema. Given the hilar mass, postobstructive pneumonia is a definite consideration.   IMPRESSION: 1. Support apparatus in good position. 2. Improved RIGHT lung airspace disease with small focus of airspace opacity at the RIGHT cardiophrenic angle. 3. RIGHT hilar mass better seen with improvement in airspace disease.   Electronically Signed   By: Dereck Ligas M.D.   On: 12/17/2014 07:24   Dg Chest Portable 1 View  12/15/2014   CLINICAL DATA:  Endotracheal tube placement.  Initial encounter.  EXAM: PORTABLE CHEST - 1 VIEW  COMPARISON:  Chest radiograph performed earlier today at 7:12 p.m.  FINDINGS: The patient's endotracheal tube is seen ending 4 cm above the carina. An enteric tube is noted extending below the diaphragm. A right-sided chest port is seen ending about the mid SVC.  There  is right-sided volume loss, with mild rightward mediastinal shift. Patchy right-sided airspace opacity is more prominent peripherally, raising concern for pneumonia. No definite pleural effusion or pneumothorax is seen.  The cardiomediastinal silhouette is borderline normal in size. No acute osseous abnormalities are seen.  IMPRESSION: 1. Endotracheal tube seen ending 4 cm above the carina. 2. New right-sided volume loss, with patchy right-sided airspace opacity, which may reflect pneumonia,   Electronically Signed   By: Garald Balding M.D.   On: 12/15/2014 22:03   Dg Chest Portable 1 View  12/15/2014   CLINICAL DATA:  Pneumonia  EXAM: PORTABLE CHEST - 1 VIEW  COMPARISON:  None. Patient's prior x-ray from 2002 is not available for comparison.  FINDINGS: The heart size is enlarged. The aorta is tortuous. A right central venous line is identified with distal tip in superior vena cava. There is no focal infiltrate, pulmonary edema, or pleural effusion. No acute abnormalities identified within the visualized bones. The visualized skeletal structures are unremarkable.  IMPRESSION: No active cardiopulmonary disease.   Electronically Signed   By: Abelardo Diesel M.D.   On: 12/15/2014 19:31     CBC  Recent  Labs Lab 12/15/14 1731 12/15/14 1734 12/16/14 0245 12/16/14 0407 12/16/14 1225 12/18/14 0241  WBC 17.1*  --  13.4*  --  12.2* 15.4*  HGB 9.3* 10.5* 7.5* 8.2* 7.6* 9.2*  HCT 28.2* 31.0* 23.2* 25.4* 23.2* 28.3*  PLT 339  --  287  --  303 319  MCV 75.4*  --  76.3*  --  75.1* 76.5*  MCH 24.9*  --  24.7*  --  24.6* 24.9*  MCHC 33.0  --  32.3  --  32.8 32.5  RDW 19.3*  --  19.5*  --  19.6* 20.1*  LYMPHSABS 1.2  --   --   --  0.8 0.8  MONOABS 0.9  --   --   --  0.4 0.8  EOSABS 0.0  --   --   --  0.0 0.0  BASOSABS 0.0  --   --   --  0.0 0.0    Chemistries   Recent Labs Lab 12/15/14 1731 12/15/14 1734 12/16/14 0245 12/17/14 0230 12/18/14 0241  NA 127* 125* 127* 130* 135  K 4.3 4.2 4.1 4.6 3.8  CL 87* 87* 91* 97* 99*  CO2 25  --  '27 23 27  '$ GLUCOSE 345* 356* 261* 266* 156*  BUN 21* 26* '16 20 16  '$ CREATININE 0.81 0.70 0.59* 0.62 0.64  CALCIUM 8.9  --  8.1* 7.9* 8.1*  MG  --   --  1.6* 1.6*  --   AST 36  --   --   --   --   ALT 43  --   --   --   --   ALKPHOS 162*  --   --   --   --   BILITOT 0.4  --   --   --   --    ------------------------------------------------------------------------------------------------------------------ estimated creatinine clearance is 98.9 mL/min (by C-G formula based on Cr of 0.64). ------------------------------------------------------------------------------------------------------------------ No results for input(s): HGBA1C in the last 72 hours. ------------------------------------------------------------------------------------------------------------------  Recent Labs  12/17/14 0230  TRIG 157*   ------------------------------------------------------------------------------------------------------------------ No results for input(s): TSH, T4TOTAL, T3FREE, THYROIDAB in the last 72 hours.  Invalid input(s): FREET3 ------------------------------------------------------------------------------------------------------------------ No  results for input(s): VITAMINB12, FOLATE, FERRITIN, TIBC, IRON, RETICCTPCT in the last 72 hours.  Coagulation profile  Recent Labs Lab 12/15/14 1731  INR 1.14  No results for input(s): DDIMER in the last 72 hours.  Cardiac Enzymes  Recent Labs Lab 12/16/14 0407  TROPONINI 0.05*   ------------------------------------------------------------------------------------------------------------------ Invalid input(s): POCBNP     Time Spent in minutes   30 Lacinda Axon, Velma Agnes M.D on 12/19/2014 at 3:04 PM  Between 7am to 7pm - Pager - [phone]  After 7pm go to [email or web address] - password [facility]  [facility]   Office  [phone]

## 2014-12-19 NOTE — Progress Notes (Addendum)
Nutrition Follow-up  DOCUMENTATION CODES:   Severe malnutrition in context of chronic illness  INTERVENTION:   Glucerna Shake po TID, each supplement provides 220 kcal and 10 grams of protein  NUTRITION DIAGNOSIS:   Malnutrition related to chronic illness as evidenced by severe depletion of body fat, severe depletion of muscle mass, percent weight loss.  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 Per CT pt with brain mass/metastatic disease.   Pt extubated on 12/17/14. He has been transferred out of ICU to medical floor.   Pt very talkative at time of visit; he reports he is very anxious to go home. He is asking for "a walker with wheels on the back and a box of tissues so I can go home".   SLP following. Pt is currently on a dysphagia 3 diet with nectar thick liquids. Per pt daughter, pt is swallowing well and SLP may re-evaluate for diet advancement. She estimates pt consumed 75% of his breakfast this morning.   Pt daughter asking if she can bring pt a "sugar free milkshake" from home. Discussed diet restrictions with daughter and the need to provide liquids of appropriate consistency to prevent aspiration. Offered Glucerna shake, which he was receiving at home; pt amenable.   Discussed importance of good meal and supplement intake to promote healing and improve nutritional status. Both pt and daughter expressed appreciation for visit.   Labs reviewed.  Diet Order:  DIET DYS 3 Room service appropriate?: Yes; Fluid consistency:: Nectar  Thick  Skin:  Wound (see comment) (open area on perineum)  Last BM:  12/18/14  Height:   Ht Readings from Last 1 Encounters:  12/18/14 '6\' 1"'$  (1.854 m)    Weight:   Wt Readings from Last 1 Encounters:  12/19/14 174 lb 6.1 oz (79.1 kg)    Ideal Body Weight:  80.9 kg  BMI:  Body mass index is 23.01 kg/(m^2).  Estimated Nutritional Needs:   Kcal:  2200-2400  Protein:  110-120 grams  Fluid:  2.2-2.4 L  EDUCATION NEEDS:   Education needs addressed  Iyani Dresner A. Jimmye Norman, RD, LDN, CDE Pager: [phone] After hours Pager: [phone]

## 2014-12-20 DIAGNOSIS — E871 Hypo-osmolality and hyponatremia: Secondary | ICD-10-CM

## 2014-12-20 DIAGNOSIS — R569 Unspecified convulsions: Secondary | ICD-10-CM

## 2014-12-20 DIAGNOSIS — J449 Chronic obstructive pulmonary disease, unspecified: Secondary | ICD-10-CM

## 2014-12-20 DIAGNOSIS — J9601 Acute respiratory failure with hypoxia: Secondary | ICD-10-CM

## 2014-12-20 LAB — BASIC METABOLIC PANEL
Anion gap: 8 (ref 5–15)
BUN: 19 mg/dL (ref 6–20)
CALCIUM: 8.6 mg/dL — AB (ref 8.9–10.3)
CO2: 28 mmol/L (ref 22–32)
CREATININE: 0.64 mg/dL (ref 0.61–1.24)
Chloride: 91 mmol/L — ABNORMAL LOW (ref 101–111)
GFR calc non Af Amer: >60 mL/min (ref >60)
GLUCOSE: 310 mg/dL — AB (ref 65–99)
Potassium: 4.9 mmol/L (ref 3.5–5.1)
Sodium: 127 mmol/L — ABNORMAL LOW (ref 135–145)

## 2014-12-20 LAB — GLUCOSE, CAPILLARY
GLUCOSE-CAPILLARY: 272 mg/dL — AB (ref 65–99)
Glucose-Capillary: 328 mg/dL — ABNORMAL HIGH (ref 65–99)

## 2014-12-20 LAB — CBC
HCT: 27.3 % — ABNORMAL LOW (ref 39.0–52.0)
HEMOGLOBIN: 8.8 g/dL — AB (ref 13.0–17.0)
MCH: 24.4 pg — ABNORMAL LOW (ref 26.0–34.0)
MCHC: 32.2 g/dL (ref 30.0–36.0)
MCV: 75.8 fL — AB (ref 78.0–100.0)
Platelets: 308 10*3/uL (ref 150–400)
RBC: 3.6 MIL/uL — AB (ref 4.22–5.81)
RDW: 19.6 % — AB (ref 11.5–15.5)
WBC: 15.4 10*3/uL — AB (ref 4.0–10.5)

## 2014-12-20 MED ORDER — INSULIN GLARGINE 100 UNIT/ML ~~LOC~~ SOLN: 10.0000 [IU] | Freq: Every day | SUBCUTANEOUS | Status: DC

## 2014-12-20 MED ORDER — INSULIN ASPART 100 UNIT/ML ~~LOC~~ SOLN: SUBCUTANEOUS | Status: DC

## 2014-12-20 MED ORDER — DEXAMETHASONE 4 MG PO TABS: 4.0000 mg | ORAL_TABLET | Freq: Four times a day (QID) | ORAL | Status: DC

## 2014-12-20 MED ORDER — DEXAMETHASONE 4 MG PO TABS
4.0000 mg | ORAL_TABLET | Freq: Four times a day (QID) | ORAL | Status: DC
  Administered 2014-12-20: 4 mg via ORAL
  Filled 2014-12-20 (×4): qty 1

## 2014-12-20 MED ORDER — INSULIN GLARGINE 100 UNIT/ML ~~LOC~~ SOLN
10.0000 [IU] | Freq: Every day | SUBCUTANEOUS | Status: DC
  Administered 2014-12-20: 10 [IU] via SUBCUTANEOUS
  Filled 2014-12-20: qty 0.1

## 2014-12-20 MED ORDER — LEVETIRACETAM 750 MG PO TABS: 1500.0000 mg | ORAL_TABLET | Freq: Two times a day (BID) | ORAL | Status: DC

## 2014-12-20 NOTE — Clinical Social Work Placement (Signed)
   CLINICAL SOCIAL WORK PLACEMENT  NOTE  Date:  12/20/2014  Patient Details  Name: Frank Krueger MRN: [id number] Date of Birth: August 03, 1945  Clinical Social Work is seeking post-discharge placement for this patient at the [place] level of care (*CSW will initial, date and re-position this form in  chart as items are completed):  Yes   Patient/family provided with [place] Work Department's list of facilities offering this level of care within the geographic area requested by the patient (or if unable, by the patient's family).  Yes   Patient/family informed of their freedom to choose among providers that offer the needed level of care, that participate in Medicare, Medicaid or managed care program needed by the patient, have an available bed and are willing to accept the patient.  Yes   Patient/family informed of [facility]'s ownership interest in [facility] and [facility], as well as of the fact that they are under no obligation to receive care at these facilities.  PASRR submitted to EDS on 12/19/14     PASRR number received on 12/19/14     Existing PASRR number confirmed on       FL2 transmitted to all facilities in geographic area requested by pt/family on 12/20/14     FL2 transmitted to all facilities within larger geographic area on       Patient informed that his/her managed care company has contracts with or will negotiate with certain facilities, including the following:        Yes   Patient/family informed of bed offers received.  Patient chooses bed at [facility]     Physician recommends and patient chooses bed at      Patient to be transferred to [facility] on 12/20/14.  Patient to be transferred to facility by Daughter     Patient family notified on 12/20/14 of transfer.  Name of family member notified:  [name]     PHYSICIAN       Additional Comment:  Per MD patient ready for DC to [facility]. [facility] day SN[facility]. Daughter wanted [facility], but facility is not willing to extend a bed offer. RN, patient, patient's family, and facility notified of DC. RN given number for report. DC packet on chart - RN has given to daughter so that she can give this to the facility. Daughter will transport CSW signing off.   _______________________________________________ Liz Beach MSW, [place], [place], [phone]

## 2014-12-20 NOTE — Clinical Social Work Note (Signed)
Clinical Social Work Assessment  Patient Details  Name: Frank Krueger MRN: [id number] Date of Birth: 08-13-1945  Date of referral:  12/20/14               Reason for consult:  Facility Placement                Permission sought to share information with:  Family Supports Permission granted to share information::  Yes, Verbal Permission Granted  Name::     [name]  Relationship::  Daughter  Contact Information:  [phone]  Housing/Transportation Living arrangements for the past 2 months:  [place] of Information:  Patient, Adult Children Patient Interpreter Needed:  None Criminal Activity/Legal Involvement Pertinent to Current Situation/Hospitalization:  No - Comment as needed Significant Relationships:  Adult Children, Other Family Members, Pets Lives with:  Self, Pets Do you feel safe going back to the place where you live?  Yes Need for family participation in patient care:  Yes (Comment)  Care giving concerns:  Patient daughter very involved in patient care and expresses concerns regarding limitations for [facility] benefit.  Patient daughter states that there is not adequate support at this time for a home placement and is not satisfied with SNF options - CSW reassured that with [facility] approval patient would be placed at a contract facility.  Patient daughter not happy but understanding.   Social Worker assessment / plan:  Holiday representative met with patient and patient daughter at bedside to offer support and discuss patient needs at discharge.  Patient states that he has been living at home alone with his dog and having intermittent support from his daughter.  Patient was driving prior to this hospitalization and felt that he still maintained a good sense of independence.  Patient daughter expresses concerns about patient placement at SNF due to his previous independence, however agrees that he will not manage at home alone.  Patient and patient daughter agreed to  ST-SNF placement at a [place] contract facility.  CSW initiated paperwork to [facility] and will follow up tomorrow for approval.  CSW remains available for support and to facilitate patient discharge needs once medically stable.  Employment status:  Retired Forensic scientist:  [facility] Benefit PT Recommendations:  [place] / Referral to community resources:  [place]  Patient/Family's Response to care:  Patient and family recognize that their concerns lie within the [place] system and are appreciative for [facility] and the medical team caring for patient.  Patient daughter hesitant about contract facilities but verbalizes understanding.  Patient/Family's Understanding of and Emotional Response to Diagnosis, Current Treatment, and Prognosis:  Patient daughter verbalizes her understanding of patient prognosis and potential need for long term placement pending improvement.  Patient daughter also discussed potential for patient to return to her home if needed.  Emotional Assessment Appearance:  Appears older than stated age, Disheveled Attitude/Demeanor/Rapport:   (Confused, however appropriate at times in conversation) Affect (typically observed):  Accepting, Calm, Happy Orientation:  Oriented to Self, Oriented to Place, Fluctuating Orientation (Suspected and/or reported Sundowners) Alcohol / Substance use:  Not Applicable Psych involvement (Current and /or in the community):  No (Comment)  Discharge Needs  Concerns to be addressed:  Discharge Planning Concerns Readmission within the last 30 days:  No Current discharge risk:  Lack of support system Barriers to Discharge:  Continued Medical Work up, [employer], [place]

## 2014-12-20 NOTE — Progress Notes (Signed)
Physical Therapy Treatment Patient Details Name: Frank Krueger MRN: [id number] DOB: 07/01/1945 Today's Date: 12/20/2014    History of Present Illness 69 y/o man with stage IV lung adeno CA admitted with status epilepticus, likely related to brain metsPt with other significant PMHx of HTN, cerebral aneurysm (with rupture), COPD, SDH, scoliosis of lumbar spine, lung CA with brain mets, and left crainiotomy.     PT Comments    Pt is progressing well today with both his cognition and his physical mobility.  He no longer requires two person assist for gait, however, due to continued safety and awareness issues combined with poor balance he would still benefit from SNF placement for rehab at discharge. PT will continue to follow acutely.   Follow Up Recommendations  SNF     Equipment Recommendations  Rolling walker with 5" wheels    Recommendations for Other Services   NA     Precautions / Restrictions Precautions Precautions: Fall Precaution Comments: impulsive with decreased safety awareness    Mobility               Transfers Overall transfer level: Needs assistance Equipment used: Rolling walker (2 wheeled) Transfers: Sit to/from Stand Sit to Stand: Min guard         General transfer comment: Min guard assist to support trunk to get to standing from chair, posterior preference.   Ambulation/Gait Ambulation/Gait assistance: Min assist Ambulation Distance (Feet): 200 Feet Assistive device: Rolling walker (2 wheeled) Gait Pattern/deviations: Step-through pattern;Staggering left;Drifts right/left Gait velocity: too fast to be safe at this time, verbal cues to slow speed.    General Gait Details: Pt drifts left in his RW and pulls the walker to the left.  Therapist supporting his trunk for balance and assisting him with maneuvering the RW.           Balance Overall balance assessment: Needs assistance         Standing balance support: Bilateral upper  extremity supported;Single extremity supported;No upper extremity supported Standing balance-Leahy Scale: Fair                      Cognition Arousal/Alertness: Awake/alert Behavior During Therapy: Impulsive Overall Cognitive Status: Impaired/Different from baseline     Current Attention Level: Sustained Memory: Decreased short-term memory;Decreased recall of precautions Following Commands: Follows one step commands consistently;Follows multi-step commands inconsistently Safety/Judgement: Decreased awareness of safety;Decreased awareness of deficits Awareness: Emergent Problem Solving: Difficulty sequencing;Requires verbal cues;Requires tactile cues General Comments: Pt much less confused today, but continues to be impulsive and unsafe    Exercises General Exercises - Upper Extremity Shoulder Flexion: AROM;Both;10 reps;Seated Elbow Flexion: AROM;Both;10 reps;Seated Elbow Extension: AROM;Both;10 reps;Seated General Exercises - Lower Extremity Long Arc Quad: AROM;Both;10 reps;Seated Hip ABduction/ADduction: AROM;Both;10 reps;Seated Hip Flexion/Marching: AROM;Both;10 reps;Seated Toe Raises: AROM;Both;10 reps;Seated Heel Raises: AROM;Both;10 reps;Seated Mini-Sqauts: AROM;Both;5 reps;Seated        Pertinent Vitals/Pain Pain Assessment: No/denies pain           PT Goals (current goals can now be found in the care plan section) Acute Rehab PT Goals Patient Stated Goal: per daughter goal is for independence prior to home Progress towards PT goals: Progressing toward goals    Frequency  Min 3X/week    PT Plan Current plan remains appropriate       End of Session Equipment Utilized During Treatment: Gait belt Activity Tolerance: Patient tolerated treatment well Patient left: in chair;with call bell/phone within reach;with chair alarm set  Time: 8350-7573 PT Time Calculation (min) (ACUTE ONLY): 21 min  Charges:  $Gait Training: 8-22 mins                       Carmelle Bamberg B. [place], [place], DPT [phone]   12/20/2014, 6:19 PM
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
## 2014-12-20 NOTE — Care Management Note (Signed)
Case Management Note  Patient Details  Name: Frank Krueger MRN: [id number] Date of Birth: Sep 28, 1945  Subjective/Objective:   Patient is for dc today to snf, CSW following                 Action/Plan:   Expected Discharge Date:                  Expected Discharge Plan:  Skilled Nursing Facility  In-House Referral:  Clinical Social Work  Discharge planning Services  CM Consult  Post Acute Care Choice:    Choice offered to:     DME Arranged:    DME Agency:     HH Arranged:    [place] Agency:     Status of Service:  Completed, signed off  Medicare Important Message Given:    Date Medicare IM Given:    Medicare IM give by:    Date Additional Medicare IM Given:    Additional Medicare Important Message give by:     If discussed at [place] of Stay Meetings, dates discussed:    Additional Comments:  Zenon Mayo, RN 12/20/2014, 10:59 AM

## 2014-12-20 NOTE — Progress Notes (Signed)
Pt prepared for d/c to SNF. IV d/c'd. Skin intact except as most recently charted. Vitals are stable. Report called to receiving facility. Pt to be transported by family to [facility].

## 2014-12-21 ENCOUNTER — Emergency Department (HOSPITAL_COMMUNITY): Payer: Non-veteran care

## 2014-12-21 ENCOUNTER — Encounter (HOSPITAL_COMMUNITY): Payer: Self-pay

## 2014-12-21 ENCOUNTER — Inpatient Hospital Stay (HOSPITAL_COMMUNITY)
Admission: EM | Admit: 2014-12-21 | Discharge: 2015-01-02 | DRG: 871 | Disposition: A | Discharge status: Home / Self Care | Payer: Non-veteran care | Attending: Internal Medicine | Admitting: Internal Medicine

## 2014-12-21 DIAGNOSIS — T380X5A Adverse effect of glucocorticoids and synthetic analogues, initial encounter: Secondary | ICD-10-CM | POA: Diagnosis present

## 2014-12-21 DIAGNOSIS — E119 Type 2 diabetes mellitus without complications: Secondary | ICD-10-CM | POA: Diagnosis present

## 2014-12-21 DIAGNOSIS — Z923 Personal history of irradiation: Secondary | ICD-10-CM | POA: Diagnosis not present

## 2014-12-21 DIAGNOSIS — Z880 Allergy status to penicillin: Secondary | ICD-10-CM

## 2014-12-21 DIAGNOSIS — J449 Chronic obstructive pulmonary disease, unspecified: Secondary | ICD-10-CM | POA: Diagnosis present

## 2014-12-21 DIAGNOSIS — Z515 Encounter for palliative care: Secondary | ICD-10-CM | POA: Diagnosis not present

## 2014-12-21 DIAGNOSIS — N390 Urinary tract infection, site not specified: Secondary | ICD-10-CM | POA: Diagnosis present

## 2014-12-21 DIAGNOSIS — E222 Syndrome of inappropriate secretion of antidiuretic hormone: Secondary | ICD-10-CM | POA: Diagnosis present

## 2014-12-21 DIAGNOSIS — R5381 Other malaise: Secondary | ICD-10-CM | POA: Diagnosis present

## 2014-12-21 DIAGNOSIS — I5031 Acute diastolic (congestive) heart failure: Secondary | ICD-10-CM | POA: Diagnosis present

## 2014-12-21 DIAGNOSIS — I10 Essential (primary) hypertension: Secondary | ICD-10-CM | POA: Diagnosis present

## 2014-12-21 DIAGNOSIS — C3431 Malignant neoplasm of lower lobe, right bronchus or lung: Secondary | ICD-10-CM | POA: Diagnosis not present

## 2014-12-21 DIAGNOSIS — R569 Unspecified convulsions: Secondary | ICD-10-CM

## 2014-12-21 DIAGNOSIS — Z794 Long term (current) use of insulin: Secondary | ICD-10-CM | POA: Diagnosis not present

## 2014-12-21 DIAGNOSIS — A419 Sepsis, unspecified organism: Secondary | ICD-10-CM | POA: Diagnosis not present

## 2014-12-21 DIAGNOSIS — E1165 Type 2 diabetes mellitus with hyperglycemia: Secondary | ICD-10-CM | POA: Diagnosis not present

## 2014-12-21 DIAGNOSIS — R197 Diarrhea, unspecified: Secondary | ICD-10-CM | POA: Diagnosis present

## 2014-12-21 DIAGNOSIS — E785 Hyperlipidemia, unspecified: Secondary | ICD-10-CM | POA: Diagnosis present

## 2014-12-21 DIAGNOSIS — Z7189 Other specified counseling: Secondary | ICD-10-CM | POA: Diagnosis not present

## 2014-12-21 DIAGNOSIS — W19XXXA Unspecified fall, initial encounter: Secondary | ICD-10-CM | POA: Diagnosis present

## 2014-12-21 DIAGNOSIS — E43 Unspecified severe protein-calorie malnutrition: Secondary | ICD-10-CM | POA: Diagnosis present

## 2014-12-21 DIAGNOSIS — D630 Anemia in neoplastic disease: Secondary | ICD-10-CM | POA: Diagnosis present

## 2014-12-21 DIAGNOSIS — G40909 Epilepsy, unspecified, not intractable, without status epilepticus: Secondary | ICD-10-CM | POA: Diagnosis present

## 2014-12-21 DIAGNOSIS — Z7902 Long term (current) use of antithrombotics/antiplatelets: Secondary | ICD-10-CM

## 2014-12-21 DIAGNOSIS — F17210 Nicotine dependence, cigarettes, uncomplicated: Secondary | ICD-10-CM | POA: Diagnosis present

## 2014-12-21 DIAGNOSIS — Z5111 Encounter for antineoplastic chemotherapy: Secondary | ICD-10-CM | POA: Diagnosis not present

## 2014-12-21 DIAGNOSIS — C3491 Malignant neoplasm of unspecified part of right bronchus or lung: Secondary | ICD-10-CM | POA: Diagnosis not present

## 2014-12-21 DIAGNOSIS — E860 Dehydration: Secondary | ICD-10-CM | POA: Diagnosis present

## 2014-12-21 DIAGNOSIS — Z79899 Other long term (current) drug therapy: Secondary | ICD-10-CM

## 2014-12-21 DIAGNOSIS — I272 Other secondary pulmonary hypertension: Secondary | ICD-10-CM | POA: Diagnosis present

## 2014-12-21 DIAGNOSIS — C3490 Malignant neoplasm of unspecified part of unspecified bronchus or lung: Secondary | ICD-10-CM | POA: Diagnosis present

## 2014-12-21 DIAGNOSIS — R1110 Vomiting, unspecified: Secondary | ICD-10-CM

## 2014-12-21 DIAGNOSIS — IMO0001 Reserved for inherently not codable concepts without codable children: Secondary | ICD-10-CM | POA: Diagnosis present

## 2014-12-21 DIAGNOSIS — R1310 Dysphagia, unspecified: Secondary | ICD-10-CM | POA: Diagnosis present

## 2014-12-21 DIAGNOSIS — C7931 Secondary malignant neoplasm of brain: Secondary | ICD-10-CM | POA: Diagnosis present

## 2014-12-21 DIAGNOSIS — E871 Hypo-osmolality and hyponatremia: Secondary | ICD-10-CM | POA: Diagnosis present

## 2014-12-21 DIAGNOSIS — C3492 Malignant neoplasm of unspecified part of left bronchus or lung: Secondary | ICD-10-CM | POA: Diagnosis not present

## 2014-12-21 DIAGNOSIS — I951 Orthostatic hypotension: Secondary | ICD-10-CM | POA: Diagnosis not present

## 2014-12-21 DIAGNOSIS — IMO0002 Reserved for concepts with insufficient information to code with codable children: Secondary | ICD-10-CM | POA: Diagnosis present

## 2014-12-21 DIAGNOSIS — J189 Pneumonia, unspecified organism: Secondary | ICD-10-CM | POA: Diagnosis not present

## 2014-12-21 HISTORY — DX: Type 2 diabetes mellitus without complications: E11.9

## 2014-12-21 LAB — COMPREHENSIVE METABOLIC PANEL
ALK PHOS: 133 U/L — AB (ref 38–126)
ALT: 41 U/L (ref 17–63)
ANION GAP: 9 (ref 5–15)
AST: 27 U/L (ref 15–41)
Albumin: 1.6 g/dL — ABNORMAL LOW (ref 3.5–5.0)
BILIRUBIN TOTAL: 0.9 mg/dL (ref 0.3–1.2)
BUN: 16 mg/dL (ref 6–20)
CALCIUM: 8.5 mg/dL — AB (ref 8.9–10.3)
CO2: 26 mmol/L (ref 22–32)
Chloride: 90 mmol/L — ABNORMAL LOW (ref 101–111)
Creatinine, Ser: 0.77 mg/dL (ref 0.61–1.24)
Glucose, Bld: 209 mg/dL — ABNORMAL HIGH (ref 65–99)
Potassium: 4.1 mmol/L (ref 3.5–5.1)
Sodium: 125 mmol/L — ABNORMAL LOW (ref 135–145)
TOTAL PROTEIN: 5.7 g/dL — AB (ref 6.5–8.1)

## 2014-12-21 LAB — CBC WITH DIFFERENTIAL/PLATELET
BASOS ABS: 0 10*3/uL (ref 0.0–0.1)
Basophils Relative: 0 % (ref 0–1)
EOS ABS: 0 10*3/uL (ref 0.0–0.7)
Eosinophils Relative: 0 % (ref 0–5)
HCT: 28.7 % — ABNORMAL LOW (ref 39.0–52.0)
Hemoglobin: 9.6 g/dL — ABNORMAL LOW (ref 13.0–17.0)
LYMPHS PCT: 10 % — AB (ref 12–46)
Lymphs Abs: 2 10*3/uL (ref 0.7–4.0)
MCH: 25.3 pg — AB (ref 26.0–34.0)
MCHC: 33.4 g/dL (ref 30.0–36.0)
MCV: 75.5 fL — ABNORMAL LOW (ref 78.0–100.0)
Monocytes Absolute: 1 10*3/uL (ref 0.1–1.0)
Monocytes Relative: 5 % (ref 3–12)
NEUTROS ABS: 17.2 10*3/uL — AB (ref 1.7–7.7)
NEUTROS PCT: 85 % — AB (ref 43–77)
PLATELETS: 301 10*3/uL (ref 150–400)
RBC: 3.8 MIL/uL — ABNORMAL LOW (ref 4.22–5.81)
RDW: 19.6 % — ABNORMAL HIGH (ref 11.5–15.5)
WBC: 20.2 10*3/uL — AB (ref 4.0–10.5)

## 2014-12-21 LAB — URINALYSIS, ROUTINE W REFLEX MICROSCOPIC
Bilirubin Urine: NEGATIVE
GLUCOSE, UA: NEGATIVE mg/dL
Hgb urine dipstick: NEGATIVE
KETONES UR: NEGATIVE mg/dL
Nitrite: NEGATIVE
PH: 7 (ref 5.0–8.0)
Protein, ur: NEGATIVE mg/dL
Specific Gravity, Urine: 1.015 (ref 1.005–1.030)
Urobilinogen, UA: 2 mg/dL — ABNORMAL HIGH (ref 0.0–1.0)

## 2014-12-21 LAB — URINE MICROSCOPIC-ADD ON

## 2014-12-21 LAB — GLUCOSE, CAPILLARY: Glucose-Capillary: 165 mg/dL — ABNORMAL HIGH (ref 65–99)

## 2014-12-21 LAB — I-STAT CG4 LACTIC ACID, ED: Lactic Acid, Venous: 1.36 mmol/L (ref 0.5–2.0)

## 2014-12-21 MED ORDER — LEVOFLOXACIN IN D5W 750 MG/150ML IV SOLN
750.0000 mg | INTRAVENOUS | Status: DC
  Administered 2014-12-22 – 2014-12-24 (×3): 750 mg via INTRAVENOUS
  Filled 2014-12-21 (×4): qty 150

## 2014-12-21 MED ORDER — LEVETIRACETAM 750 MG PO TABS
1500.0000 mg | ORAL_TABLET | Freq: Two times a day (BID) | ORAL | Status: DC
  Administered 2014-12-22 – 2015-01-02 (×24): 1500 mg via ORAL
  Filled 2014-12-21 (×28): qty 2

## 2014-12-21 MED ORDER — VANCOMYCIN HCL IN DEXTROSE 1-5 GM/200ML-% IV SOLN
1000.0000 mg | Freq: Once | INTRAVENOUS | Status: AC
  Administered 2014-12-21: 1000 mg via INTRAVENOUS
  Filled 2014-12-21: qty 200

## 2014-12-21 MED ORDER — INSULIN GLARGINE 100 UNIT/ML ~~LOC~~ SOLN
10.0000 [IU] | Freq: Every day | SUBCUTANEOUS | Status: DC
  Administered 2014-12-22 – 2014-12-25 (×5): 10 [IU] via SUBCUTANEOUS
  Filled 2014-12-21 (×6): qty 0.1

## 2014-12-21 MED ORDER — SODIUM CHLORIDE 0.9 % IV SOLN
INTRAVENOUS | Status: DC
  Administered 2014-12-21 – 2014-12-23 (×2): via INTRAVENOUS
  Administered 2014-12-23: 1000 mL via INTRAVENOUS

## 2014-12-21 MED ORDER — POLYVINYL ALCOHOL 1.4 % OP SOLN
1.0000 [drp] | Freq: Four times a day (QID) | OPHTHALMIC | Status: DC
  Administered 2014-12-22 – 2015-01-01 (×43): 1 [drp] via OPHTHALMIC
  Filled 2014-12-21 (×2): qty 15

## 2014-12-21 MED ORDER — SODIUM CHLORIDE 0.9 % IJ SOLN
3.0000 mL | Freq: Two times a day (BID) | INTRAMUSCULAR | Status: DC
  Administered 2014-12-21 – 2014-12-27 (×12): 3 mL via INTRAVENOUS

## 2014-12-21 MED ORDER — ATORVASTATIN CALCIUM 40 MG PO TABS
40.0000 mg | ORAL_TABLET | Freq: Every day | ORAL | Status: DC
  Administered 2014-12-22 – 2015-01-01 (×11): 40 mg via ORAL
  Filled 2014-12-21 (×11): qty 1

## 2014-12-21 MED ORDER — SODIUM CHLORIDE 0.9 % IV BOLUS (SEPSIS)
2000.0000 mL | Freq: Once | INTRAVENOUS | Status: AC
  Administered 2014-12-21: 2000 mL via INTRAVENOUS

## 2014-12-21 MED ORDER — VANCOMYCIN HCL IN DEXTROSE 750-5 MG/150ML-% IV SOLN
750.0000 mg | Freq: Three times a day (TID) | INTRAVENOUS | Status: DC
  Administered 2014-12-22 – 2014-12-23 (×4): 750 mg via INTRAVENOUS
  Filled 2014-12-21 (×9): qty 150

## 2014-12-21 MED ORDER — PANTOPRAZOLE SODIUM 40 MG PO TBEC
40.0000 mg | DELAYED_RELEASE_TABLET | Freq: Every day | ORAL | Status: DC
  Administered 2014-12-22 – 2014-12-25 (×5): 40 mg via ORAL
  Filled 2014-12-21 (×5): qty 1

## 2014-12-21 MED ORDER — SODIUM CHLORIDE 0.9 % IV BOLUS (SEPSIS): 1000.0000 mL | Freq: Once | INTRAVENOUS | Status: DC

## 2014-12-21 MED ORDER — LEVOFLOXACIN IN D5W 750 MG/150ML IV SOLN
750.0000 mg | Freq: Once | INTRAVENOUS | Status: AC
  Administered 2014-12-21: 750 mg via INTRAVENOUS
  Filled 2014-12-21: qty 150

## 2014-12-21 MED ORDER — CLOPIDOGREL BISULFATE 75 MG PO TABS
75.0000 mg | ORAL_TABLET | Freq: Every day | ORAL | Status: DC
  Administered 2014-12-22 – 2015-01-02 (×13): 75 mg via ORAL
  Filled 2014-12-21 (×13): qty 1

## 2014-12-21 MED ORDER — DEXAMETHASONE 4 MG PO TABS
4.0000 mg | ORAL_TABLET | Freq: Four times a day (QID) | ORAL | Status: DC
  Administered 2014-12-22 – 2015-01-02 (×43): 4 mg via ORAL
  Filled 2014-12-21 (×57): qty 1

## 2014-12-21 MED ORDER — GABAPENTIN 300 MG PO CAPS
300.0000 mg | ORAL_CAPSULE | Freq: Two times a day (BID) | ORAL | Status: DC
  Administered 2014-12-22 – 2015-01-02 (×24): 300 mg via ORAL
  Filled 2014-12-21 (×24): qty 1

## 2014-12-21 MED ORDER — GLUCERNA SHAKE PO LIQD
237.0000 mL | Freq: Three times a day (TID) | ORAL | Status: DC
  Administered 2014-12-22 – 2015-01-01 (×33): 237 mL via ORAL
  Filled 2014-12-21: qty 237

## 2014-12-21 MED ORDER — INSULIN ASPART 100 UNIT/ML ~~LOC~~ SOLN
0.0000 [IU] | Freq: Three times a day (TID) | SUBCUTANEOUS | Status: DC
  Administered 2014-12-22: 5 [IU] via SUBCUTANEOUS
  Administered 2014-12-22: 8 [IU] via SUBCUTANEOUS
  Administered 2014-12-22: 2 [IU] via SUBCUTANEOUS
  Administered 2014-12-23: 8 [IU] via SUBCUTANEOUS
  Administered 2014-12-23: 11 [IU] via SUBCUTANEOUS
  Administered 2014-12-23: 5 [IU] via SUBCUTANEOUS
  Administered 2014-12-24: 15 [IU] via SUBCUTANEOUS
  Administered 2014-12-24: 8 [IU] via SUBCUTANEOUS
  Administered 2014-12-24 – 2014-12-25 (×2): 11 [IU] via SUBCUTANEOUS
  Administered 2014-12-25 (×2): 8 [IU] via SUBCUTANEOUS
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
## 2014-12-21 NOTE — ED Notes (Signed)
[facility]- pt coming from [facility] after he reportedly fell last night. Pt family reports he has been declining since he was discharged from here yesterday. Pt is a&o X4.
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
## 2014-12-22 DIAGNOSIS — G40909 Epilepsy, unspecified, not intractable, without status epilepticus: Secondary | ICD-10-CM | POA: Diagnosis present

## 2014-12-22 DIAGNOSIS — C3490 Malignant neoplasm of unspecified part of unspecified bronchus or lung: Secondary | ICD-10-CM | POA: Diagnosis present

## 2014-12-22 DIAGNOSIS — C3492 Malignant neoplasm of unspecified part of left bronchus or lung: Secondary | ICD-10-CM

## 2014-12-22 DIAGNOSIS — Z515 Encounter for palliative care: Secondary | ICD-10-CM

## 2014-12-22 DIAGNOSIS — E1165 Type 2 diabetes mellitus with hyperglycemia: Secondary | ICD-10-CM | POA: Diagnosis present

## 2014-12-22 DIAGNOSIS — IMO0002 Reserved for concepts with insufficient information to code with codable children: Secondary | ICD-10-CM | POA: Diagnosis present

## 2014-12-22 DIAGNOSIS — R5381 Other malaise: Secondary | ICD-10-CM | POA: Diagnosis present

## 2014-12-22 DIAGNOSIS — IMO0001 Reserved for inherently not codable concepts without codable children: Secondary | ICD-10-CM | POA: Diagnosis present

## 2014-12-22 DIAGNOSIS — C7931 Secondary malignant neoplasm of brain: Secondary | ICD-10-CM | POA: Diagnosis present

## 2014-12-22 DIAGNOSIS — C3491 Malignant neoplasm of unspecified part of right bronchus or lung: Secondary | ICD-10-CM

## 2014-12-22 LAB — URINE CULTURE

## 2014-12-22 LAB — OSMOLALITY: OSMOLALITY: 274 mosm/kg — AB (ref 275–300)

## 2014-12-22 LAB — BASIC METABOLIC PANEL
ANION GAP: 7 (ref 5–15)
BUN: 12 mg/dL (ref 6–20)
CHLORIDE: 92 mmol/L — AB (ref 101–111)
CO2: 26 mmol/L (ref 22–32)
Calcium: 7.7 mg/dL — ABNORMAL LOW (ref 8.9–10.3)
Creatinine, Ser: 0.7 mg/dL (ref 0.61–1.24)
Glucose, Bld: 154 mg/dL — ABNORMAL HIGH (ref 65–99)
POTASSIUM: 3.7 mmol/L (ref 3.5–5.1)
SODIUM: 125 mmol/L — AB (ref 135–145)

## 2014-12-22 LAB — CBC
HCT: 25.7 % — ABNORMAL LOW (ref 39.0–52.0)
HEMOGLOBIN: 8.4 g/dL — AB (ref 13.0–17.0)
MCH: 24.7 pg — ABNORMAL LOW (ref 26.0–34.0)
MCHC: 32.7 g/dL (ref 30.0–36.0)
MCV: 75.6 fL — ABNORMAL LOW (ref 78.0–100.0)
PLATELETS: 261 10*3/uL (ref 150–400)
RBC: 3.4 MIL/uL — AB (ref 4.22–5.81)
RDW: 19.6 % — ABNORMAL HIGH (ref 11.5–15.5)
WBC: 17.8 10*3/uL — AB (ref 4.0–10.5)

## 2014-12-22 LAB — EXPECTORATED SPUTUM ASSESSMENT W GRAM STAIN, RFLX TO RESP C

## 2014-12-22 LAB — OSMOLALITY, URINE: Osmolality, Ur: 446 mOsm/kg (ref 390–1090)

## 2014-12-22 LAB — NA AND K (SODIUM & POTASSIUM), RAND UR
Potassium Urine: 30 mmol/L
SODIUM UR: 102 mmol/L

## 2014-12-22 LAB — OCCULT BLOOD X 1 CARD TO LAB, STOOL: Fecal Occult Bld: NEGATIVE

## 2014-12-22 LAB — GLUCOSE, CAPILLARY
GLUCOSE-CAPILLARY: 207 mg/dL — AB (ref 65–99)
GLUCOSE-CAPILLARY: 332 mg/dL — AB (ref 65–99)
Glucose-Capillary: 150 mg/dL — ABNORMAL HIGH (ref 65–99)
Glucose-Capillary: 271 mg/dL — ABNORMAL HIGH (ref 65–99)

## 2014-12-22 LAB — LACTIC ACID, PLASMA: LACTIC ACID, VENOUS: 2.1 mmol/L — AB (ref 0.5–2.0)

## 2014-12-22 LAB — MAGNESIUM: MAGNESIUM: 1.3 mg/dL — AB (ref 1.7–2.4)

## 2014-12-22 LAB — EXPECTORATED SPUTUM ASSESSMENT W REFEX TO RESP CULTURE

## 2014-12-22 LAB — URIC ACID: Uric Acid, Serum: 2.3 mg/dL — ABNORMAL LOW (ref 4.4–7.6)

## 2014-12-22 MED ORDER — ENOXAPARIN SODIUM 40 MG/0.4ML ~~LOC~~ SOLN
40.0000 mg | SUBCUTANEOUS | Status: DC
  Administered 2014-12-22 – 2015-01-01 (×11): 40 mg via SUBCUTANEOUS
  Filled 2014-12-22 (×11): qty 0.4

## 2014-12-22 MED ORDER — SODIUM CHLORIDE 0.9 % IJ SOLN: 10.0000 mL | INTRAMUSCULAR | Status: DC | PRN

## 2014-12-22 MED ORDER — ACETAMINOPHEN 325 MG PO TABS
650.0000 mg | ORAL_TABLET | ORAL | Status: DC | PRN
  Administered 2014-12-22 – 2015-01-01 (×9): 650 mg via ORAL
  Filled 2014-12-22 (×9): qty 2

## 2014-12-22 MED ORDER — IPRATROPIUM-ALBUTEROL 0.5-2.5 (3) MG/3ML IN SOLN
3.0000 mL | Freq: Four times a day (QID) | RESPIRATORY_TRACT | Status: DC
  Administered 2014-12-22 – 2014-12-23 (×3): 3 mL via RESPIRATORY_TRACT
  Filled 2014-12-22 (×3): qty 3

## 2014-12-22 MED ORDER — INSULIN ASPART 100 UNIT/ML ~~LOC~~ SOLN
5.0000 [IU] | Freq: Once | SUBCUTANEOUS | Status: AC
  Administered 2014-12-23: 5 [IU] via SUBCUTANEOUS

## 2014-12-22 MED ORDER — STARCH (THICKENING) PO POWD
ORAL | Status: DC | PRN
  Filled 2014-12-22 (×2): qty 227

## 2014-12-22 MED ORDER — SODIUM CHLORIDE 0.9 % IJ SOLN
10.0000 mL | Freq: Two times a day (BID) | INTRAMUSCULAR | Status: DC
  Administered 2014-12-22 – 2014-12-25 (×6): 10 mL

## 2014-12-22 MED ORDER — SODIUM CHLORIDE 0.9 % IV BOLUS (SEPSIS)
500.0000 mL | Freq: Once | INTRAVENOUS | Status: AC
  Administered 2014-12-22: 500 mL via INTRAVENOUS

## 2014-12-22 MED ORDER — DM-GUAIFENESIN ER 30-600 MG PO TB12
1.0000 | ORAL_TABLET | Freq: Two times a day (BID) | ORAL | Status: DC
  Administered 2014-12-22 – 2014-12-27 (×11): 1 via ORAL
  Filled 2014-12-22 (×11): qty 1

## 2014-12-22 MED ORDER — MAGNESIUM SULFATE 50 % IJ SOLN
3.0000 g | Freq: Once | INTRAVENOUS | Status: AC
  Administered 2014-12-22: 3 g via INTRAVENOUS
  Filled 2014-12-22: qty 6
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
## 2014-12-22 NOTE — Progress Notes (Signed)
[place] TEAM 1 - Stepdown/ICU TEAM Progress Note  Frank Krueger TML:[id number] DOB: 05/30/1945 DOA: 12/21/2014 PCP: No primary care provider on file.  Admit HPI / Brief Narrative: 69 y.o. 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 Was just admitted to hospital from 8/20-8/24 for status epilepticus. During that stay he was intubated and up in the ICU due to status epilepticus. Ultimately discharged from hospital 8/24 to rehab facility. Since getting to rehab facility he has had decline. Numerous falls yesterday, generalized weakness, increased confusion. He was brought back to the ED this evening.   HPI/Subjective: 8/26 A/O 4 though somewhat confused. Patient and daughter extremely displeased with [facility] SNF and state after discharge will return home. Patient just completed whole brain radiation last week and is expected to start chemotherapy next week. Patient receives oncology care at the [facility].      Assessment/Plan:  Sepsis unspecified organism -Continue current antibiotics -Patient has just completed course of whole brain XRT + high-dose steroid-induced. 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 -Spoke with daughter who will bring oncology records from [place] -Patient just completed whole brain XRT, scheduled for chemotherapy to start next week.  Diabetes type 2 uncontrolled -Lantus 10 units daily -Moderate SSI  Hyponatremia -Possibly secondary to his dehydration from sepsis  -Monitor closely  Hypomagnesemia -Magnesium IV 3 gm  CHF? -Echocardiogram pending  Goals of care -Both patient and daughter state intent to return to home instead of back to [facility] upon completion of hospital treatment. -Have placed  palliative care consult to help patient and daughter investigate home health resources available.   Code Status: FULL Family Communication: Daughter present at time of exam Disposition Plan: Resolution sepsis    Consultants:   Procedure/Significant Events:    Culture 8/25 blood pending 8/25 urine pending 8/26 sputum pending  Antibiotics: Levofloxacin 8/25>> Vancomycin 8/25>>   DVT prophylaxis:    Devices Right chest wall port   LINES / TUBES:      Continuous Infusions: . sodium chloride 125 mL/hr at 12/21/14 2348    Objective: VITAL SIGNS: Temp: 98 F (36.7 C) (08/26 0800) Temp Source: Oral (08/26 0800) BP: 113/64 mmHg (08/26 0800) Pulse Rate: 104 (08/26 0800) SPO2; FIO2:   Intake/Output Summary (Last 24 hours) at 12/22/14 1331 Last data filed at 12/22/14 0600  Gross per 24 hour  Intake    925 ml  Output    400 ml  Net    525 ml     Exam: General:  alert, some confusion, cachectic, No acute respiratory distress Eyes: Negative headache, eye pain, double vision,negative scleral hemorrhage ENT: Negative Runny nose, negative ear pain, negative tinnitus, negative gingival bleeding Neck:  Negative scars, masses, torticollis, lymphadenopathy, JVD Lungs: Clear to auscultation bilaterally without wheezes or crackles Cardiovascular:  tachycardic, Regular rhythm without murmur gallop or rub normal S1 and S2 Abdomen:negative abdominal pain, negative dysphagia, nondistended, positive soft, bowel sounds, no rebound, no ascites, no appreciable mass Extremities: No significant cyanosis, clubbing. Bilateral pedal edema foot- mid calf 2+. Multiple bruises on bilateral and shins Psychiatric:  Negative depression, negative anxiety, negative fatigue, negative mania Neurologic:  Cranial nerves II through XII intact, tongue/uvula midline, all extremities muscle strength 5/5, sensation intact throughout,  negative dysarthria, negative expressive aphasia, negative  receptive aphasia.    Data Reviewed: Basic Metabolic Panel:  Recent Labs Lab 12/16/14 0245 12/17/14 0230 12/18/14 0241 12/20/14 0527 12/21/14 1910 12/22/14 0218 12/22/14 1200  NA 127* 130* 135 127* 125* 125*  --   K 4.1 4.6 3.8 4.9 4.1 3.7  --   CL 91* 97* 99* 91* 90* 92*  --   CO2 '27 23 27 28 26 26  '$ --   GLUCOSE 261* 266* 156* 310* 209* 154*  --   BUN '16 20 16 19 16 12  '$ --   CREATININE 0.59* 0.62 0.64 0.64 0.77 0.70  --   CALCIUM 8.1* 7.9* 8.1* 8.6* 8.5* 7.7*  --   MG 1.6* 1.6*  --   --   --   --  1.3*  PHOS 2.5  --   --   --   --   --   --    Liver Function Tests:  Recent Labs Lab 12/15/14 1731 12/21/14 1910  AST 36 27  ALT 43 41  ALKPHOS 162* 133*  BILITOT 0.4 0.9  PROT 6.1* 5.7*  ALBUMIN 2.1* 1.6*   No results for input(s): LIPASE, AMYLASE in the last 168 hours. No results for input(s): AMMONIA in the last 168 hours. CBC:  Recent Labs Lab 12/15/14 1731  12/16/14 1225 12/18/14 0241 12/20/14 0527 12/21/14 1910 12/22/14 0218  WBC 17.1*  < > 12.2* 15.4* 15.4* 20.2* 17.8*  NEUTROABS 15.0*  --  11.0* 13.8*  --  17.2*  --   HGB 9.3*  < > 7.6* 9.2* 8.8* 9.6* 8.4*  HCT 28.2*  < > 23.2* 28.3* 27.3* 28.7* 25.7*  MCV 75.4*  < > 75.1* 76.5* 75.8* 75.5* 75.6*  PLT 339  < > 303 319 308 301 261  < > = values in this interval not displayed. Cardiac Enzymes:  Recent Labs Lab 12/16/14 0407  TROPONINI 0.05*   BNP (last 3 results) No results for input(s): BNP in the last 8760 hours.  ProBNP (last 3 results) No results for input(s): PROBNP in the last 8760 hours.  CBG:  Recent Labs Lab 12/20/14 0752 12/20/14 1230 12/21/14 2359 12/22/14 0808 12/22/14 1236  GLUCAP 272* 328* 165* 207* 150*    Recent Results (from the past 240 hour(s))  MRSA PCR Screening     Status: None   Collection Time: 12/16/14 12:13 AM  Result Value Ref Range Status   MRSA by PCR NEGATIVE NEGATIVE Final    Comment:        The GeneXpert MRSA Assay (FDA approved for NASAL  specimens only), is one component of a comprehensive MRSA colonization surveillance program. It is not intended to diagnose MRSA infection nor to guide or monitor treatment for MRSA infections.   Culture, blood (routine x 2)     Status: None (Preliminary result)   Collection Time: 12/21/14  7:24 PM  Result Value Ref Range Status   Specimen Description BLOOD RIGHT ARM  Final   Special Requests BOTTLES DRAWN AEROBIC AND ANAEROBIC 10CC  Final   Culture PENDING  Incomplete   Report Status PENDING  Incomplete  Urine culture     Status: None (Preliminary result)   Collection Time: 12/21/14 10:53 PM  Result Value Ref Range Status   Specimen Description URINE, CATHETERIZED  Final   Special Requests NONE  Final   Culture NO GROWTH < 12 HOURS  Final   Report Status PENDING  Incomplete     Studies:  Recent  x-ray studies have been reviewed in detail by the Attending Physician  Scheduled Meds:  Scheduled Meds: . atorvastatin  40 mg Oral q1800  . clopidogrel  75 mg Oral Daily  . dexamethasone  4 mg Oral QID  . dextromethorphan-guaiFENesin  1 tablet Oral BID  . feeding supplement (GLUCERNA SHAKE)  237 mL Oral TID BM  . gabapentin  300 mg Oral BID  . insulin aspart  0-15 Units Subcutaneous TID WC  . insulin glargine  10 Units Subcutaneous Daily  . ipratropium-albuterol  3 mL Nebulization Q6H  . levETIRAcetam  1,500 mg Oral BID  . levofloxacin (LEVAQUIN) IV  750 mg Intravenous Q24H  . magnesium sulfate 1 - 4 g bolus IVPB  3 g Intravenous Once  . pantoprazole  40 mg Oral Daily  . polyvinyl alcohol  1 drop Both Eyes QID  . sodium chloride  3 mL Intravenous Q12H  . vancomycin  750 mg Intravenous Q8H    Time spent on care of this patient: 40 mins   Jeret Goyer, Geraldo Docker , MD  [facility] Office  [phone] Pager - [phone]  On-Call/Text Page:      [email or web address]      password [email or web address]  If 7PM-7AM, please contact night-coverage [email or web address] Password [email or web address] 12/22/2014, 1:31  PM   LOS: 1 day   Care during the described time interval was provided by me .  I have reviewed this patient's available data, including medical history, events of note, physical examination, and all test results as part of my evaluation. I have personally reviewed and interpreted all radiology studies.   Dia Crawford, MD [phone] Pager

## 2014-12-22 NOTE — Progress Notes (Signed)
Critical Lab value Lactic acid 2.1 Md DR Sherral Hammers made aware

## 2014-12-22 NOTE — Consult Note (Signed)
WOC wound consult note Reason for Consult: sacral pressure ulcer Wound type: MASD (moisture associated skin damage) with frequent stooling.  Skin blanches easilty Pressure Ulcer POA: No Measurement: 4cm x 8cm x 0 area over the bilateral buttocks, superficial skin peeling Wound bed: intact skin Drainage (amount, consistency, odor) none Periwound: intact Dressing procedure/placement/frequency: Barrier cream to protect area from incontinence, reapply after each episode.  Chair pad for pressure redistribution for patient's comfort when up sitting.   Discussed POC with patient and bedside nurse.  Re consult if needed, will not follow at this time. Thanks  Zaine Elsass [place], [place] [phone])

## 2014-12-23 ENCOUNTER — Inpatient Hospital Stay (HOSPITAL_COMMUNITY): Payer: Non-veteran care

## 2014-12-23 DIAGNOSIS — Z5111 Encounter for antineoplastic chemotherapy: Secondary | ICD-10-CM

## 2014-12-23 LAB — GLUCOSE, CAPILLARY
GLUCOSE-CAPILLARY: 257 mg/dL — AB (ref 65–99)
GLUCOSE-CAPILLARY: 165 mg/dL — AB (ref 65–99)
Glucose-Capillary: 217 mg/dL — ABNORMAL HIGH (ref 65–99)
Glucose-Capillary: 312 mg/dL — ABNORMAL HIGH (ref 65–99)

## 2014-12-23 LAB — VANCOMYCIN, TROUGH: VANCOMYCIN TR: 11 ug/mL (ref 10.0–20.0)

## 2014-12-23 MED ORDER — IPRATROPIUM-ALBUTEROL 0.5-2.5 (3) MG/3ML IN SOLN: 3.0000 mL | RESPIRATORY_TRACT | Status: DC | PRN

## 2014-12-23 MED ORDER — FUROSEMIDE 10 MG/ML IJ SOLN
20.0000 mg | Freq: Once | INTRAMUSCULAR | Status: AC
  Administered 2014-12-23: 20 mg via INTRAVENOUS
  Filled 2014-12-23: qty 2

## 2014-12-23 MED ORDER — VANCOMYCIN HCL IN DEXTROSE 1-5 GM/200ML-% IV SOLN
1000.0000 mg | Freq: Three times a day (TID) | INTRAVENOUS | Status: DC
  Administered 2014-12-23 – 2014-12-24 (×4): 1000 mg via INTRAVENOUS
  Filled 2014-12-23 (×6): qty 200

## 2014-12-23 NOTE — Progress Notes (Signed)
Patient ID: Frank Krueger, male   DOB: [birth year], 69 y.o.   MRN: [id number]  [facility] PROGRESS NOTE  CAREY JOHNDROW HCW:[id number] DOB: 10-11-1945 DOA: 12/21/2014 PCP: No primary care provider on file.   Brief narrative:    69 y.o. male with HTN, HLD, ruptured cerebral aneurysm, SDH, COPD, Stage 4 lung CA and mets to brain, recent admission from 8/20-8/24 for status epilepticus. During that stay he was intubated and in the ICU due to status epilepticus. Ultimately discharged from hospital 8/24 to rehab facility. Since getting to rehab facility he continued to decline with progressive confusion and was brought back to the ED for further evaluation.  Assessment/Plan:   Sepsis, likely related to UTI, unknown pathogen at this time - UA suggestive of UTI, urine culture with multiple species and recollection recommended  - currently on vancomycin, Levaquin day #3 - follow up on lactic acid in AM to ensure resolution - narrow down ABX as soon as possible   Diarrhea - ? ABX induced - nothing this afternoon, if it recurs, will obtain C. 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 -Echocardiogram pending  DVT prophylaxis - Lovenox SQ  Code Status: Full.  Family Communication:  plan of care discussed  with the patient, daughter over the phone  Disposition Plan: Home when stable.   IV access:  Peripheral IV  Procedures and diagnostic studies:    Ct Head Wo Contrast 12/15/2014   Interval development of an 8 mm mass within the right cerebral hemisphere with surrounding edema, concerning for metastasis.  Patchy hypodensities within the right thalamus are nonspecific however age-indeterminate infarct is not excluded.  Age-indeterminate hypodensities within the right frontal lobe white matter which may represent an age indeterminate infarct or potentially from prior intracranial device as there is an overlying burr hole in this location.    Nm Pet Image Initial (pi) Skull Base To Thigh 12/04/2014   Markedly hypermetabolic central right lung lesion consistent with the patient's known reported history of lung cancer. This is associated with bilateral pulmonary nodules, some of the larger of which demonstrate hypermetabolism. There are also hypermetabolic metastases in the right supraclavicular region, mediastinum, left hilum, liver, right adrenal gland, and skeleton.  Dg Chest Port 1 View 12/21/2014   Right hilar mass lesion unchanged. Improved basilar infiltration. No developing consolidation.     Dg Chest Port 1 View 12/17/2014  Support apparatus in good position. 2. Improved RIGHT lung airspace disease with small focus of airspace opacity at the RIGHT cardiophrenic angle. 3. RIGHT hilar mass better seen with improvement in airspace disease.    Dg Chest Portable 1 View 12/15/2014 Endotracheal tube seen ending 4 cm above the carina. 2. New right-sided volume loss, with patchy right-sided airspace opacity,  which may reflect pneumonia,    Medical Consultants:  None  Other Consultants:  PT  IAnti-Infectives:   Vancomycin 8/25 --> Levaquin 8/25 -->  Faye Ramsay, MD  [facility] Pager [phone]  If 7PM-7AM, please contact night-coverage [email or web address] Password [email or web address] 12/23/2014, 2:25 PM   LOS: 2  days   HPI/Subjective: No events overnight.   Objective: Filed Vitals:   12/23/14 0120 12/23/14 0400 12/23/14 0800 12/23/14 1200  BP:  112/51 110/55 109/47  Pulse:  78 66 68  Temp:  98.2 F (36.8 C) 97.4 F (36.3 C) 97.4 F (36.3 C)  TempSrc:  Oral Oral Oral  Resp:  '15 16 15  '$ Height:      Weight:      SpO2: 97% 97%      Intake/Output Summary (Last 24 hours) at 12/23/14 1425 Last data filed at 12/23/14 1200  Gross per 24 hour  Intake 2733.42 ml  Output   2500 ml  Net 233.42 ml    Exam:   General:  Pt is alert, follows commands appropriately, not in acute distress  Cardiovascular: Regular rate and rhythm, no rubs, no gallops  Respiratory: Clear to auscultation bilaterally, no wheezing, diminished breath sounds at bases   Abdomen: Soft, non tender, non distended, bowel sounds present, no guarding  Extremities: pulses DP and PT palpable bilaterally  Neuro: Grossly nonfocal  Data Reviewed: Basic Metabolic Panel:  Recent Labs Lab 12/17/14 0230 12/18/14 0241 12/20/14 0527 12/21/14 1910 12/22/14 0218 12/22/14 1200  NA 130* 135 127* 125* 125*  --   K 4.6 3.8 4.9 4.1 3.7  --   CL 97* 99* 91* 90* 92*  --   CO2 '23 27 28 26 26  '$ --   GLUCOSE 266* 156* 310* 209* 154*  --   BUN '20 16 19 16 12  '$ --   CREATININE 0.62 0.64 0.64 0.77 0.70  --   CALCIUM 7.9* 8.1* 8.6* 8.5* 7.7*  --   MG 1.6*  --   --   --   --  1.3*   Liver Function Tests:  Recent Labs Lab 12/21/14 1910  AST 27  ALT 41  ALKPHOS 133*  BILITOT 0.9  PROT 5.7*  ALBUMIN 1.6*   CBC:  Recent Labs Lab 12/18/14 0241 12/20/14 0527 12/21/14 1910 12/22/14 0218  WBC 15.4* 15.4* 20.2* 17.8*  NEUTROABS 13.8*  --  17.2*  --   HGB 9.2* 8.8* 9.6* 8.4*  HCT 28.3* 27.3* 28.7* 25.7*  MCV 76.5* 75.8* 75.5* 75.6*  PLT 319 308 301 261   CBG:  Recent Labs Lab 12/21/14 2359 12/22/14 0808 12/22/14 1236 12/22/14 1637 12/22/14 2203  GLUCAP 165* 207* 150* 271* 332*    Recent Results (from the past  240 hour(s))  MRSA PCR Screening     Status: None   Collection Time: 12/16/14 12:13 AM  Result Value Ref Range Status   MRSA by PCR NEGATIVE NEGATIVE Final  Culture, blood (routine x 2)     Status: None (Preliminary result)   Collection Time: 12/21/14  7:21 PM  Result Value Ref Range Status   Specimen Description BLOOD LEFT ARM  Final   Special Requests BOTTLES DRAWN AEROBIC AND ANAEROBIC 5CC  Final   Culture NO GROWTH 2 DAYS  Final   Report Status PENDING  Incomplete  Culture, blood (routine x 2)     Status: None (Preliminary result)   Collection Time: 12/21/14  7:24 PM  Result Value Ref Range Status   Specimen Description BLOOD RIGHT ARM  Final   Special Requests BOTTLES DRAWN AEROBIC AND ANAEROBIC 10CC  Final   Culture NO GROWTH 2 DAYS  Final   Report Status PENDING  Incomplete  Urine culture     Status: None   Collection Time: 12/21/14 10:53 PM  Result Value Ref Range Status   Specimen Description URINE, CATHETERIZED  Final   Special Requests NONE  Final   Culture MULTIPLE SPECIES PRESENT, SUGGEST RECOLLECTION  Final   Report Status 12/22/2014 FINAL  Final  Culture, expectorated sputum-assessment     Status: None   Collection Time: 12/22/14  1:52 PM  Result Value Ref Range Status   Specimen Description EXPECTORATED SPUTUM  Final   Special Requests Immunocompromised  Final   Sputum evaluation   Final    THIS SPECIMEN IS ACCEPTABLE. RESPIRATORY CULTURE REPORT TO FOLLOW.   Report Status 12/22/2014 FINAL  Final  Culture, respiratory (NON-Expectorated)     Status: None (Preliminary result)   Collection Time: 12/22/14  1:52 PM  Result Value Ref Range Status   Specimen Description SPUTUM  Final   Special Requests NONE  Final   Gram Stain   Final    FEW WBC PRESENT, PREDOMINANTLY MONONUCLEAR RARE SQUAMOUS EPITHELIAL CELLS PRESENT FEW GRAM POSITIVE COCCI IN PAIRS IN CLUSTERS RARE GRAM POSITIVE RODS RARE GRAM NEGATIVE RODS    Culture PENDING  Incomplete   Report Status  PENDING  Incomplete     Scheduled Meds: . atorvastatin  40 mg Oral q1800  . clopidogrel  75 mg Oral Daily  . dexamethasone  4 mg Oral QID  . enoxaparin injection  40 mg Subcutaneous Q24H  . gabapentin  300 mg Oral BID  . insulin aspart  0-15 Units Subcutaneous TID WC  . insulin glargine  10 Units Subcutaneous Daily  . levETIRAcetam  1,500 mg Oral BID  . levofloxacin  IV  750 mg Intravenous Q24H  . pantoprazole  40 mg Oral Daily  . vancomycin  1,000 mg Intravenous Q8H   Continuous Infusions: . sodium chloride 1,000 mL (12/23/14 0910)

## 2014-12-23 NOTE — Progress Notes (Signed)
  Echocardiogram 2D Echocardiogram has been performed.  Darlina Sicilian M 12/23/2014, 9:56 AM

## 2014-12-23 NOTE — Consult Note (Signed)
ANTIBIOTIC CONSULT NOTE - Follow-up  Pharmacy Consult for Vancomycin and Levaquin Indication: sepsis  Allergies  Allergen Reactions  . Penicillins Hives    Patient Measurements: Height: '6\' 1"'$  (185.4 cm) Weight: 171 lb 8.3 oz (77.8 kg) IBW/kg (Calculated) : 79.9  Vital Signs: Temp: 97.4 F (36.3 C) (08/27 0800) Temp Source: Oral (08/27 0800) BP: 110/55 mmHg (08/27 0800) Pulse Rate: 66 (08/27 0800) Intake/Output from previous day: 08/26 0701 - 08/27 0700 In: 3693.1 [P.O.:1100; I.V.:1887.1; IV NIOEVOJJK:093] Out: 1200 [Urine:1200] Intake/Output from this shift: Total I/O In: 13 [I.V.:13] Out: 1900 [Urine:1900]  Labs:  Recent Labs  12/21/14 1910 12/22/14 0218  WBC 20.2* 17.8*  HGB 9.6* 8.4*  PLT 301 261  CREATININE 0.77 0.70   Estimated Creatinine Clearance: 97.3 mL/min (by C-G formula based on Cr of 0.7).  Microbiology: Recent Results (from the past 720 hour(s))  MRSA PCR Screening     Status: None   Collection Time: 12/16/14 12:13 AM  Result Value Ref Range Status   MRSA by PCR NEGATIVE NEGATIVE Final    Comment:        The GeneXpert MRSA Assay (FDA approved for NASAL specimens only), is one component of a comprehensive MRSA colonization surveillance program. It is not intended to diagnose MRSA infection nor to guide or monitor treatment for MRSA infections.   Culture, blood (routine x 2)     Status: None (Preliminary result)   Collection Time: 12/21/14  7:21 PM  Result Value Ref Range Status   Specimen Description BLOOD LEFT ARM  Final   Special Requests BOTTLES DRAWN AEROBIC AND ANAEROBIC 5CC  Final   Culture NO GROWTH < 24 HOURS  Final   Report Status PENDING  Incomplete  Culture, blood (routine x 2)     Status: None (Preliminary result)   Collection Time: 12/21/14  7:24 PM  Result Value Ref Range Status   Specimen Description BLOOD RIGHT ARM  Final   Special Requests BOTTLES DRAWN AEROBIC AND ANAEROBIC 10CC  Final   Culture NO GROWTH < 24  HOURS  Final   Report Status PENDING  Incomplete  Urine culture     Status: None   Collection Time: 12/21/14 10:53 PM  Result Value Ref Range Status   Specimen Description URINE, CATHETERIZED  Final   Special Requests NONE  Final   Culture MULTIPLE SPECIES PRESENT, SUGGEST RECOLLECTION  Final   Report Status 12/22/2014 FINAL  Final  Culture, expectorated sputum-assessment     Status: None   Collection Time: 12/22/14  1:52 PM  Result Value Ref Range Status   Specimen Description EXPECTORATED SPUTUM  Final   Special Requests Immunocompromised  Final   Sputum evaluation   Final    THIS SPECIMEN IS ACCEPTABLE. RESPIRATORY CULTURE REPORT TO FOLLOW.   Report Status 12/22/2014 FINAL  Final  Culture, respiratory (NON-Expectorated)     Status: None (Preliminary result)   Collection Time: 12/22/14  1:52 PM  Result Value Ref Range Status   Specimen Description SPUTUM  Final   Special Requests NONE  Final   Gram Stain   Final    FEW WBC PRESENT, PREDOMINANTLY MONONUCLEAR RARE SQUAMOUS EPITHELIAL CELLS PRESENT FEW GRAM POSITIVE COCCI IN PAIRS IN CLUSTERS RARE GRAM POSITIVE RODS RARE GRAM NEGATIVE RODS    Culture PENDING  Incomplete   Report Status PENDING  Incomplete    Assessment: 39yom who was recently discharged \=after admission for seizures secondary to brain metastases (stage IV adenocarcinoma) returned to the ED today with fever,  leukocytosis, and general decline. Started on vancomycin and levaquin. Pt is afebrile and WBC is elevated at 17.8. Scr is WNL. A vancomycin trough was checked today and is below goal.   8/25 Vanc>> 8/25 Levaquin>>  8/25 blood x2>>NGTD 8/25 Urine - NEG 8/26 Sputum - pending  8/27 VT = 11 on '750mg'$  IV Q8H  Goal of Therapy:  Vancomycin trough level 15-20 mcg/ml  Plan:  - Change vancomycin to 1gm IV Q8H - Continue levaquin '750mg'$  IV Q24H - F/u renal fxn, C&S, clinical status and trough at [facility] - F/u LOT and ability to de-escalate  Salome Arnt,  PharmD, [facility]PS Pager # [phone] 12/23/2014 12:24 PM

## 2014-12-24 LAB — GLUCOSE, CAPILLARY
GLUCOSE-CAPILLARY: 265 mg/dL — AB (ref 65–99)
GLUCOSE-CAPILLARY: 324 mg/dL — AB (ref 65–99)
GLUCOSE-CAPILLARY: 307 mg/dL — AB (ref 65–99)
Glucose-Capillary: 374 mg/dL — ABNORMAL HIGH (ref 65–99)
Glucose-Capillary: 305 mg/dL — ABNORMAL HIGH (ref 65–99)

## 2014-12-24 LAB — CBC
HEMATOCRIT: 23.9 % — AB (ref 39.0–52.0)
Hemoglobin: 7.8 g/dL — ABNORMAL LOW (ref 13.0–17.0)
MCH: 24.7 pg — ABNORMAL LOW (ref 26.0–34.0)
MCHC: 32.6 g/dL (ref 30.0–36.0)
MCV: 75.6 fL — ABNORMAL LOW (ref 78.0–100.0)
PLATELETS: 237 10*3/uL (ref 150–400)
RBC: 3.16 MIL/uL — ABNORMAL LOW (ref 4.22–5.81)
RDW: 19.4 % — AB (ref 11.5–15.5)
WBC: 17.1 10*3/uL — AB (ref 4.0–10.5)

## 2014-12-24 LAB — BASIC METABOLIC PANEL
ANION GAP: 8 (ref 5–15)
BUN: 14 mg/dL (ref 6–20)
CALCIUM: 8 mg/dL — AB (ref 8.9–10.3)
CO2: 28 mmol/L (ref 22–32)
Chloride: 89 mmol/L — ABNORMAL LOW (ref 101–111)
Creatinine, Ser: 0.53 mg/dL — ABNORMAL LOW (ref 0.61–1.24)
Glucose, Bld: 227 mg/dL — ABNORMAL HIGH (ref 65–99)
Potassium: 4.1 mmol/L (ref 3.5–5.1)
Sodium: 125 mmol/L — ABNORMAL LOW (ref 135–145)

## 2014-12-24 LAB — MAGNESIUM: MAGNESIUM: 1.3 mg/dL — AB (ref 1.7–2.4)

## 2014-12-24 LAB — LACTIC ACID, PLASMA: LACTIC ACID, VENOUS: 1 mmol/L (ref 0.5–2.0)

## 2014-12-24 MED ORDER — MAGNESIUM SULFATE 2 GM/50ML IV SOLN
2.0000 g | Freq: Once | INTRAVENOUS | Status: AC
  Administered 2014-12-24: 2 g via INTRAVENOUS
  Filled 2014-12-24: qty 50

## 2014-12-24 NOTE — Evaluation (Addendum)
Physical Therapy Evaluation Patient Details Name: Frank Krueger MRN: [id number] DOB: 1946-03-03 Today's Date: 12/24/2014   History of Present Illness  69 y/o man with stage IV lung adeno CA admitted with sepsis and AMS after recent D/C 8/24 to SNF after admission for seizures. PMHx of HTN, cerebral aneurysm (with rupture), COPD, SDH, scoliosis of lumbar spine, lung CA with brain mets, and left crainiotomy.   Clinical Impression  Pt pleasant and confused with continued need for min assist with gait and balance. Pt with 2 periods during gait of assist to prevent falls as well as min assist throughout to direct and control RW. Continue to recommend SNF as with previous admission and for home would require min assist for all mobility and 24hr supervision. Pt with impaired balance, strength, function and cognition who will benefit from acute therapy to maximize mobility, gait and balance to decrease burden of care and fall risk.     Follow Up Recommendations SNF;Supervision/Assistance - 24 hour    Equipment Recommendations  Rolling walker with 5" wheels    Recommendations for Other Services       Precautions / Restrictions Precautions Precautions: Fall Precaution Comments: impulsive with decreased safety awareness Restrictions Weight Bearing Restrictions: No      Mobility  Bed Mobility Overal bed mobility: Needs Assistance Bed Mobility: Supine to Sit     Supine to sit: Supervision     General bed mobility comments: cues for sequence with use of rail and assist for lines and safety  Transfers Overall transfer level: Needs assistance   Transfers: Sit to/from Stand Sit to Stand: Min guard         General transfer comment: cues for hand placement and safety  Ambulation/Gait Ambulation/Gait assistance: Min assist Ambulation Distance (Feet): 200 Feet Assistive device: Rolling walker (2 wheeled) Gait Pattern/deviations: Step-through pattern;Decreased stride length;Drifts  right/left;Trunk flexed   Gait velocity interpretation: Below normal speed for age/gender General Gait Details: pt tends to drift with RW, pushes RW away to the left and needs cues and assist for directing RW, positioning self in RW and to avoid obstacles  Stairs            Wheelchair Mobility    Modified Rankin (Stroke Patients Only)       Balance Overall balance assessment: Needs assistance   Sitting balance-Leahy Scale: Fair       Standing balance-Leahy Scale: Poor                               Pertinent Vitals/Pain Pain Assessment: No/denies pain  HR 75 sats 100% on RA    Home Living Family/patient expects to be discharged to:: Private residence Living Arrangements: Alone Available Help at Discharge: Family;Available PRN/intermittently Type of Home: House Home Access: Level entry     Home Layout: One level Home Equipment: Cane - single point;Shower seat;Adaptive equipment;Transport chair      Prior Function Level of Independence: Needs assistance   Gait / Transfers Assistance Needed: walking with min assist and RW since last admission with several falls since D/C to SNF           Hand Dominance        Extremity/Trunk Assessment   Upper Extremity Assessment: Overall WFL for tasks assessed           Lower Extremity Assessment: Generalized weakness      Cervical / Trunk Assessment: Kyphotic  Communication   Communication: [facility]  Cognition Arousal/Alertness: Awake/alert Behavior During Therapy: Impulsive Overall Cognitive Status: Impaired/Different from baseline Area of Impairment: Orientation;Attention;Memory;Following commands;Safety/judgement;Awareness;Problem solving Orientation Level: Disoriented to;Place;Time;Situation Current Attention Level: Sustained Memory: Decreased short-term memory;Decreased recall of precautions Following Commands: Follows one step commands consistently;Follows multi-step commands  inconsistently Safety/Judgement: Decreased awareness of safety;Decreased awareness of deficits   Problem Solving: Difficulty sequencing;Requires verbal cues;Requires tactile cues      General Comments      Exercises        Assessment/Plan    PT Assessment Patient needs continued PT services  PT Diagnosis Difficulty walking;Abnormality of gait;Altered mental status   PT Problem List Decreased strength;Decreased activity tolerance;Decreased balance;Decreased mobility;Decreased coordination;Decreased cognition;Decreased knowledge of use of DME;Decreased safety awareness  PT Treatment Interventions DME instruction;Gait training;Functional mobility training;Therapeutic activities;Therapeutic exercise;Balance training;Cognitive remediation;Patient/family education   PT Goals (Current goals can be found in the Care Plan section) Acute Rehab PT Goals Patient Stated Goal: pt unable to state but agreeable to increase gait and mobility PT Goal Formulation: With patient Time For Goal Achievement: 01/07/15 Potential to Achieve Goals: Fair    Frequency Min 2X/week   Barriers to discharge Decreased caregiver support      Co-evaluation               End of Session Equipment Utilized During Treatment: Gait belt Activity Tolerance: Patient tolerated treatment well Patient left: in chair;with call bell/phone within reach;with chair alarm set Nurse Communication: Mobility status         Time: 5643-3295 PT Time Calculation (min) (ACUTE ONLY): 24 min   Charges:   PT Evaluation $Initial PT Evaluation Tier I: 1 Procedure PT Treatments $Gait Training: 8-22 mins   PT G CodesMelford Aase 12/24/2014, 9:41 AM Elwyn Reach, [place]

## 2014-12-24 NOTE — Progress Notes (Signed)
Patient's and daughter reports missing eye glasses. All unit staff made aware. Call placed to security, dietary and laundry services. No glass turned in at this time.

## 2014-12-24 NOTE — Progress Notes (Addendum)
Patient ID: AVRAHAM BENISH, male   DOB: [birth year], 68 y.o.   MRN: [id number]  [facility] PROGRESS NOTE  HARIM BI OAC:[id number] DOB: 04-19-1946 DOA: 12/21/2014 PCP: No primary care provider on file.   Brief narrative:    69 y.o. male with HTN, HLD, ruptured cerebral aneurysm, SDH, COPD, Stage 4 lung CA and mets to brain, recent admission from 8/20-8/24 for status epilepticus. During that stay he was intubated and in the ICU due to status epilepticus. Ultimately discharged from hospital 8/24 to rehab facility. Since getting to rehab facility he continued to decline with progressive confusion and was brought back to the ED for further evaluation.  Assessment/Plan:   Sepsis, likely related to UTI, unknown pathogen at this time - UA suggestive of UTI, urine culture with multiple species and recollection recommended  - currently on vancomycin, Levaquin day #4/7, will stop vancomycin and continue Levaquin  - urine culture repeated for better recollection  - narrow down ABX as soon as possible   Intermittent ICU delirium - better this AM - allow ativan as needed for agitation   Diarrhea - ? ABX induced, C. 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  - Patient has just completed course of whole brain XRT + high-dose steroid-induced - Continue patient's current Decadron dose 4 mg daily  - chemotherapy scheduled next week   Seizure disorder - Continue Keppra 1500 mg BID  Anemia of chronic disease, malignancy - no signs of active bleeding but Hg is down a bit over the past 24 hours: 8.4 --> 7.8 - pt is asymptomatic, no chest pain and no dyspnea, reports feeling better  - transfuse for Hg < 7 - CBC in AM   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  Family Communication:  plan of care discussed with the patient, daughter over the phone  Disposition Plan: SNF recommended per PT evaluation.   IV access:  Peripheral IV  Procedures and diagnostic studies:    Ct Head Wo Contrast 12/15/2014   Interval development of an 8 mm mass within the right cerebral hemisphere with surrounding edema, concerning for metastasis.  Patchy hypodensities within the right thalamus are nonspecific however age-indeterminate infarct is not excluded.  Age-indeterminate hypodensities within the right frontal lobe white matter which may represent an age indeterminate infarct or potentially from prior intracranial device as there is an overlying burr hole in this location.    Nm Pet Image Initial (pi) Skull Base To Thigh 12/04/2014   Markedly hypermetabolic central right lung lesion consistent with the patient's known reported history of lung cancer. This is associated with bilateral pulmonary nodules, some of the larger of which demonstrate hypermetabolism. There are also hypermetabolic metastases in the right supraclavicular region, mediastinum, left hilum,  liver, right adrenal gland, and skeleton.  Dg Chest Port 1 View 12/21/2014   Right hilar mass lesion unchanged. Improved basilar infiltration. No developing consolidation.     Dg Chest Port 1 View 12/17/2014  Support apparatus in good position. 2. Improved RIGHT lung airspace disease with small focus of airspace opacity at the RIGHT cardiophrenic angle. 3. RIGHT hilar mass better seen with improvement in  airspace disease.    Dg Chest Portable 1 View 12/15/2014 Endotracheal tube seen ending 4 cm above the carina. 2. New right-sided volume loss, with patchy right-sided airspace opacity, which may reflect pneumonia,    Medical Consultants:  None  Other Consultants:  PT  IAnti-Infectives:   Vancomycin 8/25 --> 8/28 Levaquin 8/25 -->  Faye Ramsay, MD  [facility] Pager [phone]  If 7PM-7AM, please contact night-coverage [email or web address] Password [facility] 12/24/2014, 2:55 PM   LOS: 3 days   HPI/Subjective: No events overnight.   Objective: Filed Vitals:   12/24/14 0400 12/24/14 0811 12/24/14 0933 12/24/14 1200  BP: 128/66 126/60  127/61  Pulse: 65 70 75   Temp: 97.6 F (36.4 C) 97.4 F (36.3 C)  97.7 F (36.5 C)  TempSrc: Oral Oral  Oral  Resp: '27 18  18  '$ Height:      Weight:      SpO2:  98% 100% 100%    Intake/Output Summary (Last 24 hours) at 12/24/14 1455 Last data filed at 12/24/14 1425  Gross per 24 hour  Intake 1868.83 ml  Output   4025 ml  Net -2156.17 ml    Exam:   General:  Pt is alert, follows commands appropriately, not in acute distress  Cardiovascular: Regular rate and rhythm, no rubs, no gallops  Respiratory: Clear to auscultation bilaterally, scattered rhonchi, diminished breath sounds at bases   Abdomen: Soft, non tender, non distended, bowel sounds present, no guarding  Extremities: pulses DP and PT palpable bilaterally  Neuro: Grossly nonfocal  Data Reviewed: Basic Metabolic Panel:  Recent Labs Lab 12/18/14 0241 12/20/14 0527 12/21/14 1910 12/22/14 0218 12/22/14 1200 12/24/14 0555  NA 135 127* 125* 125*  --  125*  K 3.8 4.9 4.1 3.7  --  4.1  CL 99* 91* 90* 92*  --  89*  CO2 '27 28 26 26  '$ --  28  GLUCOSE 156* 310* 209* 154*  --  227*  BUN '16 19 16 12  '$ --  14  CREATININE 0.64 0.64 0.77 0.70  --  0.53*  CALCIUM 8.1* 8.6* 8.5* 7.7*  --  8.0*  MG  --   --   --   --  1.3* 1.3*   Liver Function Tests:  Recent Labs Lab  12/21/14 1910  AST 27  ALT 41  ALKPHOS 133*  BILITOT 0.9  PROT 5.7*  ALBUMIN 1.6*   CBC:  Recent Labs Lab 12/18/14 0241 12/20/14 0527 12/21/14 1910 12/22/14 0218 12/24/14 0555  WBC 15.4* 15.4* 20.2* 17.8* 17.1*  NEUTROABS 13.8*  --  17.2*  --   --   HGB 9.2* 8.8* 9.6* 8.4* 7.8*  HCT 28.3* 27.3* 28.7* 25.7* 23.9*  MCV 76.5* 75.8* 75.5* 75.6* 75.6*  PLT 319 308 301 261 237   CBG:  Recent Labs Lab 12/23/14 1221 12/23/14 1648 12/23/14 2141 12/24/14 0809 12/24/14 1232  GLUCAP 312* 257* 165* 265* 374*    Recent Results (from the past 240 hour(s))  MRSA PCR Screening     Status: None   Collection Time: 12/16/14 12:13 AM  Result Value Ref Range  Status   MRSA by PCR NEGATIVE NEGATIVE Final  Culture, blood (routine x 2)     Status: None (Preliminary result)   Collection Time: 12/21/14  7:21 PM  Result Value Ref Range Status   Specimen Description BLOOD LEFT ARM  Final   Special Requests BOTTLES DRAWN AEROBIC AND ANAEROBIC 5CC  Final   Culture NO GROWTH 2 DAYS  Final   Report Status PENDING  Incomplete  Culture, blood (routine x 2)     Status: None (Preliminary result)   Collection Time: 12/21/14  7:24 PM  Result Value Ref Range Status   Specimen Description BLOOD RIGHT ARM  Final   Special Requests BOTTLES DRAWN AEROBIC AND ANAEROBIC 10CC  Final   Culture NO GROWTH 2 DAYS  Final   Report Status PENDING  Incomplete  Urine culture     Status: None   Collection Time: 12/21/14 10:53 PM  Result Value Ref Range Status   Specimen Description URINE, CATHETERIZED  Final   Special Requests NONE  Final   Culture MULTIPLE SPECIES PRESENT, SUGGEST RECOLLECTION  Final   Report Status 12/22/2014 FINAL  Final  Culture, expectorated sputum-assessment     Status: None   Collection Time: 12/22/14  1:52 PM  Result Value Ref Range Status   Specimen Description EXPECTORATED SPUTUM  Final   Special Requests Immunocompromised  Final   Sputum evaluation   Final    THIS SPECIMEN IS  ACCEPTABLE. RESPIRATORY CULTURE REPORT TO FOLLOW.   Report Status 12/22/2014 FINAL  Final  Culture, respiratory (NON-Expectorated)     Status: None (Preliminary result)   Collection Time: 12/22/14  1:52 PM  Result Value Ref Range Status   Specimen Description SPUTUM  Final   Special Requests NONE  Final   Gram Stain   Final    FEW WBC PRESENT, PREDOMINANTLY MONONUCLEAR RARE SQUAMOUS EPITHELIAL CELLS PRESENT FEW GRAM POSITIVE COCCI IN PAIRS IN CLUSTERS RARE GRAM POSITIVE RODS RARE GRAM NEGATIVE RODS    Culture PENDING  Incomplete   Report Status PENDING  Incomplete     Scheduled Meds: . atorvastatin  40 mg Oral q1800  . clopidogrel  75 mg Oral Daily  . dexamethasone  4 mg Oral QID  . enoxaparin injection  40 mg Subcutaneous Q24H  . gabapentin  300 mg Oral BID  . insulin aspart  0-15 Units Subcutaneous TID WC  . insulin glargine  10 Units Subcutaneous Daily  . levETIRAcetam  1,500 mg Oral BID  . levofloxacin  IV  750 mg Intravenous Q24H  . pantoprazole  40 mg Oral Daily  . vancomycin  1,000 mg Intravenous Q8H   Continuous Infusions:

## 2014-12-25 LAB — GLUCOSE, CAPILLARY
GLUCOSE-CAPILLARY: 280 mg/dL — AB (ref 65–99)
GLUCOSE-CAPILLARY: 282 mg/dL — AB (ref 65–99)
Glucose-Capillary: 324 mg/dL — ABNORMAL HIGH (ref 65–99)
Glucose-Capillary: 362 mg/dL — ABNORMAL HIGH (ref 65–99)

## 2014-12-25 LAB — CBC
HEMATOCRIT: 23.6 % — AB (ref 39.0–52.0)
Hemoglobin: 7.7 g/dL — ABNORMAL LOW (ref 13.0–17.0)
MCH: 24.4 pg — AB (ref 26.0–34.0)
MCHC: 32.6 g/dL (ref 30.0–36.0)
MCV: 74.9 fL — AB (ref 78.0–100.0)
Platelets: 291 10*3/uL (ref 150–400)
RBC: 3.15 MIL/uL — ABNORMAL LOW (ref 4.22–5.81)
RDW: 19.5 % — AB (ref 11.5–15.5)
WBC: 13.9 10*3/uL — ABNORMAL HIGH (ref 4.0–10.5)

## 2014-12-25 LAB — BASIC METABOLIC PANEL
Anion gap: 8 (ref 5–15)
BUN: 17 mg/dL (ref 6–20)
CALCIUM: 8 mg/dL — AB (ref 8.9–10.3)
CO2: 29 mmol/L (ref 22–32)
CREATININE: 0.67 mg/dL (ref 0.61–1.24)
Chloride: 88 mmol/L — ABNORMAL LOW (ref 101–111)
GFR calc non Af Amer: >60 mL/min (ref >60)
GLUCOSE: 317 mg/dL — AB (ref 65–99)
Potassium: 4.4 mmol/L (ref 3.5–5.1)
Sodium: 125 mmol/L — ABNORMAL LOW (ref 135–145)

## 2014-12-25 LAB — URINE CULTURE: Culture: >=100000

## 2014-12-25 LAB — MAGNESIUM: MAGNESIUM: 1.5 mg/dL — AB (ref 1.7–2.4)

## 2014-12-25 LAB — CULTURE, RESPIRATORY W GRAM STAIN: Culture: NORMAL

## 2014-12-25 LAB — CULTURE, RESPIRATORY

## 2014-12-25 MED ORDER — INSULIN GLARGINE 100 UNIT/ML ~~LOC~~ SOLN
18.0000 [IU] | Freq: Every day | SUBCUTANEOUS | Status: DC
  Filled 2014-12-25: qty 0.18

## 2014-12-25 MED ORDER — INSULIN ASPART 100 UNIT/ML ~~LOC~~ SOLN
0.0000 [IU] | Freq: Three times a day (TID) | SUBCUTANEOUS | Status: DC
  Administered 2014-12-26 (×2): 11 [IU] via SUBCUTANEOUS
  Administered 2014-12-26: 7 [IU] via SUBCUTANEOUS
  Administered 2014-12-27 (×2): 11 [IU] via SUBCUTANEOUS
  Administered 2014-12-27: 7 [IU] via SUBCUTANEOUS
  Administered 2014-12-28: 20 [IU] via SUBCUTANEOUS
  Administered 2014-12-28: 7 [IU] via SUBCUTANEOUS
  Administered 2014-12-28: 20 [IU] via SUBCUTANEOUS
  Administered 2014-12-29: 15 [IU] via SUBCUTANEOUS
  Administered 2014-12-29 – 2014-12-30 (×3): 11 [IU] via SUBCUTANEOUS
  Administered 2014-12-30: 4 [IU] via SUBCUTANEOUS
  Administered 2014-12-30: 15 [IU] via SUBCUTANEOUS

## 2014-12-25 MED ORDER — INSULIN ASPART 100 UNIT/ML ~~LOC~~ SOLN
0.0000 [IU] | Freq: Every day | SUBCUTANEOUS | Status: DC
  Administered 2014-12-25: 4 [IU] via SUBCUTANEOUS
  Administered 2014-12-26: 5 [IU] via SUBCUTANEOUS

## 2014-12-25 MED ORDER — MAGNESIUM SULFATE 2 GM/50ML IV SOLN
2.0000 g | Freq: Once | INTRAVENOUS | Status: AC
Start: 2014-12-25 — End: 2014-12-25
  Administered 2014-12-25: 2 g via INTRAVENOUS
  Filled 2014-12-25: qty 50

## 2014-12-25 NOTE — Clinical Documentation Improvement (Signed)
Internal Medicine  Based on the clinical findings below, please document any associated diagnoses/conditions the patient has or may have.   Malnutrition (if present, please specify severity (mild, moderate, severe)  Other  Clinically Undetermined  Supporting Information: Current documentation notes "cachectic" in exam; height documented as  6'1", weight 171 lb 8.3 oz, and BMI 22.6.     Please exercise your independent, professional judgment when responding. A specific answer is not anticipated or expected.  Thank you, Mateo Flow, RN [place]

## 2014-12-25 NOTE — Care Management Note (Signed)
Case Management Note  Patient Details  Name: NOBUO NUNZIATA MRN: [id number] Date of Birth: Sep 13, 1945  Subjective/Objective:    Pt and dtr state pt will not be returning to [facility], will be returning home.  Ex son-in-law will stay with pt evenings and nights, dtr will also be with pt some evenings and nights but both work during the day.  According to April @ [place], pt has been approved for [facility] and RW, RN/PT/OT/ST through [facility] aide 2 hrs/day, 3xwk through [facility].  Discussed with dtr and offered list of private duty agencies as pt will need someone to stay with him during the day - [facility], states he is having medical problems because of Agent Orange exposure.  CM will call [facility]'s social worker @ [facility], [place] @ [phone], [phone].                 Expected Discharge Plan:  [place]  In-House Referral:     Discharge planning Services  CM Consult  Post Acute Care Choice:  Durable Medical Equipment Choice offered to:     DME Arranged:  3-N-1, Walker rolling DME Agency:  [facility] [place] Arranged:  RN, PT, OT, Speech Therapy, Nurse's Aide [employer] Agency:  [facility], [place]  Status of Service:  In process, will continue to follow   Girard Cooter, RN 12/25/2014, 1:39 PM

## 2014-12-25 NOTE — Progress Notes (Signed)
ANTIBIOTIC CONSULT NOTE - FOLLOW UP  Pharmacy Consult for Levaquin Indication: sepsis vs UTI   Allergies  Allergen Reactions  . Penicillins Hives    Patient Measurements: Height: '6\' 1"'$  (185.4 cm) Weight: 171 lb 8.3 oz (77.8 kg) IBW/kg (Calculated) : 79.9 Adjusted Body Weight:   Vital Signs: Temp: 97.9 F (36.6 C) (08/29 0740) Temp Source: Oral (08/29 0740) BP: 133/64 mmHg (08/29 1228) Pulse Rate: 74 (08/29 1228) Intake/Output from previous day: 08/28 0701 - 08/29 0700 In: 8099 [P.O.:820; I.V.:13; IV Piggyback:400] Out: 3450 [Urine:3450] Intake/Output from this shift: Total I/O In: 487 [P.O.:477; I.V.:10] Out: 500 [Urine:500]  Labs:  Recent Labs  12/24/14 0555 12/25/14 0400  WBC 17.1* 13.9*  HGB 7.8* 7.7*  PLT 237 291  CREATININE 0.53* 0.67   Estimated Creatinine Clearance: 97.3 mL/min (by C-G formula based on Cr of 0.67).  Recent Labs  12/23/14 1135  [place]     Microbiology: Recent Results (from the past 720 hour(s))  MRSA PCR Screening     Status: None   Collection Time: 12/16/14 12:13 AM  Result Value Ref Range Status   MRSA by PCR NEGATIVE NEGATIVE Final    Comment:        The GeneXpert MRSA Assay (FDA approved for NASAL specimens only), is one component of a comprehensive MRSA colonization surveillance program. It is not intended to diagnose MRSA infection nor to guide or monitor treatment for MRSA infections.   Culture, blood (routine x 2)     Status: None (Preliminary result)   Collection Time: 12/21/14  7:21 PM  Result Value Ref Range Status   Specimen Description BLOOD LEFT ARM  Final   Special Requests BOTTLES DRAWN AEROBIC AND ANAEROBIC 5CC  Final   Culture NO GROWTH 4 DAYS  Final   Report Status PENDING  Incomplete  Culture, blood (routine x 2)     Status: None (Preliminary result)   Collection Time: 12/21/14  7:24 PM  Result Value Ref Range Status   Specimen Description BLOOD RIGHT ARM  Final   Special Requests BOTTLES  DRAWN AEROBIC AND ANAEROBIC 10CC  Final   Culture NO GROWTH 4 DAYS  Final   Report Status PENDING  Incomplete  Urine culture     Status: None   Collection Time: 12/21/14 10:53 PM  Result Value Ref Range Status   Specimen Description URINE, CATHETERIZED  Final   Special Requests NONE  Final   Culture MULTIPLE SPECIES PRESENT, SUGGEST RECOLLECTION  Final   Report Status 12/22/2014 FINAL  Final  Culture, expectorated sputum-assessment     Status: None   Collection Time: 12/22/14  1:52 PM  Result Value Ref Range Status   Specimen Description EXPECTORATED SPUTUM  Final   Special Requests Immunocompromised  Final   Sputum evaluation   Final    THIS SPECIMEN IS ACCEPTABLE. RESPIRATORY CULTURE REPORT TO FOLLOW.   Report Status 12/22/2014 FINAL  Final  Culture, respiratory (NON-Expectorated)     Status: None   Collection Time: 12/22/14  1:52 PM  Result Value Ref Range Status   Specimen Description SPUTUM  Final   Special Requests NONE  Final   Gram Stain   Final    FEW WBC PRESENT, PREDOMINANTLY MONONUCLEAR RARE SQUAMOUS EPITHELIAL CELLS PRESENT FEW GRAM POSITIVE COCCI IN PAIRS IN CLUSTERS RARE GRAM POSITIVE RODS RARE GRAM NEGATIVE RODS    Culture   Final    NORMAL OROPHARYNGEAL FLORA Performed at [facility]    Report Status 12/25/2014 FINAL  Final  Urine culture     Status: None (Preliminary result)   Collection Time: 12/23/14  9:55 PM  Result Value Ref Range Status   Specimen Description URINE, CLEAN CATCH  Final   Special Requests NONE  Final   Culture NO GROWTH < 24 HOURS  Final   Report Status PENDING  Incomplete    Anti-infectives    Start     Dose/Rate Route Frequency Ordered Stop   12/23/14 1300  vancomycin (VANCOCIN) IVPB 1000 mg/200 mL premix  Status:  Discontinued     1,000 mg 200 mL/hr over 60 Minutes Intravenous Every 8 hours 12/23/14 1221 12/24/14 1509   12/22/14 2000  levofloxacin (LEVAQUIN) IVPB 750 mg     750 mg 100 mL/hr over 90 Minutes  Intravenous Every 24 hours 12/21/14 2008     12/22/14 0400  vancomycin (VANCOCIN) IVPB 750 mg/150 ml premix  Status:  Discontinued     750 mg 150 mL/hr over 60 Minutes Intravenous Every 8 hours 12/21/14 2008 12/23/14 1217   12/21/14 1945  levofloxacin (LEVAQUIN) IVPB 750 mg     750 mg 100 mL/hr over 90 Minutes Intravenous  Once 12/21/14 1939 12/21/14 2146   12/21/14 1945  vancomycin (VANCOCIN) IVPB 1000 mg/200 mL premix     1,000 mg 200 mL/hr over 60 Minutes Intravenous  Once 12/21/14 1939 12/21/14 2112      Assessment: Frank Krueger who was just discharged 8/24 after admission for seizures secondary to brain metastases (stage IV adenocarcinoma) returns to the ED today with fever, leukocytosis, and general decline. Code sepsis called. He will begin vancomycin and levaquin - first doses given in the ED (Vancomycin 1gm and Levaquin '750mg'$ ). Renal function wnl.  PMH: stage IV lung CA w/ brain mets, seizure, HTN, COPD, hyponatremia, anemia, HLD, ruptured cerebral aneurysm, SDH, COPD  ID: Levaquin D#5/7 for sepsis - Afebrile, WBC 17.1>>13.9, Scr 0.53, doses appropriate for CrCl>50  8/25 Vanc>>8/28 --8/27 VT = 11 on '750mg'$  IV Q8H 8/25 Levaquin>>  8/25 blood x2>>NGTD 8/25 Urine - NEG 8/26 Sputum - pending  Goal of Therapy:  Eradication of infection  Plan:  Levaquin '750mg'$  IV q24h x 3 more days Pharmacy will sign off. Please reconsult for further dosing assitance.    Kailash Hinze S. [place], PharmD, BCPS Clinical Staff Pharmacist Pager [phone]  Eilene Ghazi Stillinger 12/25/2014,1:14 PM

## 2014-12-25 NOTE — Progress Notes (Signed)
[place] TEAM 1 - Stepdown/ICU TEAM PROGRESS NOTE  EMRY BARBATO WUJ:[id number] DOB: 27-Aug-1945 DOA: 12/21/2014 PCP: No primary care provider on file.  Admit HPI / Brief Narrative: 69 y.o. 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 During that stay he was intubated. Ultimately discharged from hospital 8/24 to a rehab facility. At the rehab facility he was noted to have progressive confusion and was brought back to the ED for further evaluation.  HPI/Subjective: The patient is alert and conversant.  He appears to be only mildly confused.  He is highly motivated to "go home."  He denies chest pain shortness breath fevers or chills.  He continues to have loose stools.  Assessment/Plan:  Sepsis - ?related to UTI - UA suggestive of UTI, urine culture with multiple species and recollection notes only yeast (usually not a pathogen)  - stop abx tx today and follow clinically - repeat UA tomorrow  - pt improving clinically suggesting the yeast is not an actual infection   Intermittent ICU delirium - Mental status is much improved today - follow  Diarrhea - ? ABX induced - C. 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 - CBG is poorly controlled - adjust medical therapy and follow  trend  Hyponatremia - ?SIADH related to lung CA - appears to vary between 125-130 - Na remains stable ~ 125 presently  - follow trend   Hypomagnesemia - Likely due to GI losses - continue to supplement with goal 2.0  Moderate pulmonary hypertension - normal EF per TTE 12/23/14 - weight today is 77.8 kg which is down from 78.8 kg on admission - monitor daily weights and strict I/O  Code Status: FULL Family Communication: no family present at time of exam Disposition Plan: SDU  Consultants: None  Procedures: None  Antibiotics: Vancomycin 8/25 > 8/28 Levaquin 8/25 > 8/28  DVT prophylaxis: Lovenox  Objective: Blood pressure 133/64, pulse 74, temperature 98 F (36.7 C), temperature source Oral, resp. rate 17, height '6\' 1"'$  (1.854 m), weight 77.8 kg (171 lb 8.3 oz), SpO2 100 %.  Intake/Output Summary (Last 24 hours) at 12/25/14 1558 Last data filed at 12/25/14 1500  Gross per 24 hour  Intake    647 ml  Output   3275 ml  Net  -2628 ml   Exam: General: No acute respiratory distress - alert and conversant Lungs: Clear to auscultation bilaterally without wheezes or crackles Cardiovascular: Regular rate and rhythm without murmur gallop or rub normal S1 and S2 Abdomen: Nontender, nondistended, soft, bowel sounds positive, no rebound, no ascites, no appreciable mass Extremities: No significant cyanosis, clubbing, or edema bilateral lower extremities  Data Reviewed: Basic Metabolic Panel:  Recent Labs Lab 12/20/14 0527 12/21/14 1910 12/22/14 0218 12/22/14 1200 12/24/14  0555 12/25/14 0400  NA 127* 125* 125*  --  125* 125*  K 4.9 4.1 3.7  --  4.1 4.4  CL 91* 90* 92*  --  89* 88*  CO2 '28 26 26  '$ --  28 29  GLUCOSE 310* 209* 154*  --  227* 317*  BUN '19 16 12  '$ --  14 17  CREATININE 0.64 0.77 0.70  --  0.53* 0.67  CALCIUM 8.6* 8.5* 7.7*  --  8.0* 8.0*  MG  --   --   --  1.3* 1.3* 1.5*    CBC:  Recent Labs Lab 12/20/14 0527 12/21/14 1910 12/22/14 0218  12/24/14 0555 12/25/14 0400  WBC 15.4* 20.2* 17.8* 17.1* 13.9*  NEUTROABS  --  17.2*  --   --   --   HGB 8.8* 9.6* 8.4* 7.8* 7.7*  HCT 27.3* 28.7* 25.7* 23.9* 23.6*  MCV 75.8* 75.5* 75.6* 75.6* 74.9*  PLT 308 301 261 237 291    Liver Function Tests:  Recent Labs Lab 12/21/14 1910  AST 27  ALT 41  ALKPHOS 133*  BILITOT 0.9  PROT 5.7*  ALBUMIN 1.6*   CBG:  Recent Labs Lab 12/24/14 1645 12/24/14 1954 12/24/14 2109 12/25/14 0739 12/25/14 1251  GLUCAP 305* 324* 307* 280* 282*    Recent Results (from the past 240 hour(s))  MRSA PCR Screening     Status: None   Collection Time: 12/16/14 12:13 AM  Result Value Ref Range Status   MRSA by PCR NEGATIVE NEGATIVE Final    Comment:        The GeneXpert MRSA Assay (FDA approved for NASAL specimens only), is one component of a comprehensive MRSA colonization surveillance program. It is not intended to diagnose MRSA infection nor to guide or monitor treatment for MRSA infections.   Culture, blood (routine x 2)     Status: None (Preliminary result)   Collection Time: 12/21/14  7:21 PM  Result Value Ref Range Status   Specimen Description BLOOD LEFT ARM  Final   Special Requests BOTTLES DRAWN AEROBIC AND ANAEROBIC 5CC  Final   Culture NO GROWTH 4 DAYS  Final   Report Status PENDING  Incomplete  Culture, blood (routine x 2)     Status: None (Preliminary result)   Collection Time: 12/21/14  7:24 PM  Result Value Ref Range Status   Specimen Description BLOOD RIGHT ARM  Final   Special Requests BOTTLES DRAWN AEROBIC AND ANAEROBIC 10CC  Final   Culture NO GROWTH 4 DAYS  Final   Report Status PENDING  Incomplete  Urine culture     Status: None   Collection Time: 12/21/14 10:53 PM  Result Value Ref Range Status   Specimen Description URINE, CATHETERIZED  Final   Special Requests NONE  Final   Culture MULTIPLE SPECIES PRESENT, SUGGEST RECOLLECTION  Final   Report Status 12/22/2014 FINAL  Final  Culture, expectorated  sputum-assessment     Status: None   Collection Time: 12/22/14  1:52 PM  Result Value Ref Range Status   Specimen Description EXPECTORATED SPUTUM  Final   Special Requests Immunocompromised  Final   Sputum evaluation   Final    THIS SPECIMEN IS ACCEPTABLE. RESPIRATORY CULTURE REPORT TO FOLLOW.   Report Status 12/22/2014 FINAL  Final  Culture, respiratory (NON-Expectorated)     Status: None   Collection Time: 12/22/14  1:52 PM  Result Value Ref Range Status   Specimen Description SPUTUM  Final   Special Requests NONE  Final  Gram Stain   Final    FEW WBC PRESENT, PREDOMINANTLY MONONUCLEAR RARE SQUAMOUS EPITHELIAL CELLS PRESENT FEW GRAM POSITIVE COCCI IN PAIRS IN CLUSTERS RARE GRAM POSITIVE RODS RARE GRAM NEGATIVE RODS    Culture   Final    NORMAL OROPHARYNGEAL FLORA Performed at [facility]    Report Status 12/25/2014 FINAL  Final  Urine culture     Status: None   Collection Time: 12/23/14  9:55 PM  Result Value Ref Range Status   Specimen Description URINE, CLEAN CATCH  Final   Special Requests NONE  Final   Culture >=100,000 COLONIES/mL YEAST  Final   Report Status 12/25/2014 FINAL  Final     Studies:   Recent x-ray studies have been reviewed in detail by the Attending Physician  Scheduled Meds:  Scheduled Meds: . atorvastatin  40 mg Oral q1800  . clopidogrel  75 mg Oral Daily  . dexamethasone  4 mg Oral QID  . dextromethorphan-guaiFENesin  1 tablet Oral BID  . enoxaparin (LOVENOX) injection  40 mg Subcutaneous Q24H  . feeding supplement (GLUCERNA SHAKE)  237 mL Oral TID BM  . gabapentin  300 mg Oral BID  . insulin aspart  0-15 Units Subcutaneous TID WC  . insulin glargine  10 Units Subcutaneous Daily  . levETIRAcetam  1,500 mg Oral BID  . levofloxacin (LEVAQUIN) IV  750 mg Intravenous Q24H  . pantoprazole  40 mg Oral Daily  . polyvinyl alcohol  1 drop Both Eyes QID  . sodium chloride  10-40 mL Intracatheter Q12H  . sodium chloride  3 mL Intravenous  Q12H    Time spent on care of this patient: 35 mins   Zakaree Mcclenahan T , MD   [facility] Office  [phone] Pager - Text Page per Shea Evans as per below:  On-Call/Text Page:      [email or web address]      password [email or web address]  If 7PM-7AM, please contact night-coverage [email or web address] Password [email or web address] 12/25/2014, 3:58 PM   LOS: 4 days

## 2014-12-25 NOTE — Progress Notes (Signed)
Inpatient Diabetes Program Recommendations  AACE/ADA: New Consensus Statement on Inpatient Glycemic Control (2013)  Target Ranges:  Prepandial:   less than 140 mg/dL      Peak postprandial:   less than 180 mg/dL (1-2 hours)      Critically ill patients:  140 - 180 mg/dL   Results for REINHART, SAULTERS (MRN [id number]) as of 12/25/2014 08:15  Ref. Range 12/24/2014 08:09 12/24/2014 12:32 12/24/2014 16:45 12/24/2014 19:54 12/24/2014 21:09  Glucose-Capillary Latest Ref Range: 65-99 mg/dL 265 (H) 374 (H) 305 (H) 324 (H) 307 (H)   Admitted Sepsis/UTI Outpatient meds: Glipizide 2.5 mg Daily, Metformin 500 mg BID, Novolog Moderate scale, lantus 10 units QHS, Decadron 4 mg 4x/day Current orders for Inpatient glycemic control: Lantus 10 units Daily, Novolog Moderate TID  Inpatient Diabetes Program Recommendations Insulin - Basal: Glucose in the 300's. Please consider increasing basal insulin to Lantus 18-20 units Daily.  Thanks,  Tama Headings RN, MSN, [facility] Inpatient Diabetes Coordinator Team Pager [phone]

## 2014-12-26 DIAGNOSIS — I270 Primary pulmonary hypertension: Secondary | ICD-10-CM

## 2014-12-26 DIAGNOSIS — R197 Diarrhea, unspecified: Secondary | ICD-10-CM | POA: Diagnosis present

## 2014-12-26 DIAGNOSIS — I272 Pulmonary hypertension, unspecified: Secondary | ICD-10-CM | POA: Diagnosis present

## 2014-12-26 DIAGNOSIS — C7931 Secondary malignant neoplasm of brain: Secondary | ICD-10-CM | POA: Diagnosis present

## 2014-12-26 LAB — COMPREHENSIVE METABOLIC PANEL
ALBUMIN: 1.4 g/dL — AB (ref 3.5–5.0)
ALK PHOS: 156 U/L — AB (ref 38–126)
ALT: 37 U/L (ref 17–63)
ANION GAP: 10 (ref 5–15)
AST: 21 U/L (ref 15–41)
BUN: 24 mg/dL — ABNORMAL HIGH (ref 6–20)
CO2: 28 mmol/L (ref 22–32)
Calcium: 7.9 mg/dL — ABNORMAL LOW (ref 8.9–10.3)
Chloride: 85 mmol/L — ABNORMAL LOW (ref 101–111)
Creatinine, Ser: 0.72 mg/dL (ref 0.61–1.24)
GFR calc Af Amer: >60 mL/min (ref >60)
GFR calc non Af Amer: >60 mL/min (ref >60)
GLUCOSE: 439 mg/dL — AB (ref 65–99)
POTASSIUM: 4.5 mmol/L (ref 3.5–5.1)
SODIUM: 123 mmol/L — AB (ref 135–145)
Total Bilirubin: 0.5 mg/dL (ref 0.3–1.2)
Total Protein: 5.5 g/dL — ABNORMAL LOW (ref 6.5–8.1)

## 2014-12-26 LAB — CULTURE, BLOOD (ROUTINE X 2)
CULTURE: NO GROWTH
Culture: NO GROWTH

## 2014-12-26 LAB — GLUCOSE, CAPILLARY
GLUCOSE-CAPILLARY: 285 mg/dL — AB (ref 65–99)
GLUCOSE-CAPILLARY: 245 mg/dL — AB (ref 65–99)
GLUCOSE-CAPILLARY: 384 mg/dL — AB (ref 65–99)
Glucose-Capillary: 277 mg/dL — ABNORMAL HIGH (ref 65–99)

## 2014-12-26 LAB — URINE MICROSCOPIC-ADD ON

## 2014-12-26 LAB — URINALYSIS, ROUTINE W REFLEX MICROSCOPIC
Bilirubin Urine: NEGATIVE
Glucose, UA: >1000 mg/dL — AB
Hgb urine dipstick: NEGATIVE
Ketones, ur: NEGATIVE mg/dL
LEUKOCYTES UA: NEGATIVE
NITRITE: NEGATIVE
PH: 7.5 (ref 5.0–8.0)
Protein, ur: NEGATIVE mg/dL
SPECIFIC GRAVITY, URINE: 1.019 (ref 1.005–1.030)
Urobilinogen, UA: 1 mg/dL (ref 0.0–1.0)

## 2014-12-26 LAB — CBC
HCT: 26.3 % — ABNORMAL LOW (ref 39.0–52.0)
HEMOGLOBIN: 8.4 g/dL — AB (ref 13.0–17.0)
MCH: 23.9 pg — AB (ref 26.0–34.0)
MCHC: 31.9 g/dL (ref 30.0–36.0)
MCV: 74.9 fL — AB (ref 78.0–100.0)
Platelets: 333 10*3/uL (ref 150–400)
RBC: 3.51 MIL/uL — AB (ref 4.22–5.81)
RDW: 19.1 % — ABNORMAL HIGH (ref 11.5–15.5)
WBC: 15.3 10*3/uL — ABNORMAL HIGH (ref 4.0–10.5)

## 2014-12-26 MED ORDER — INSULIN GLARGINE 100 UNIT/ML ~~LOC~~ SOLN
28.0000 [IU] | Freq: Every day | SUBCUTANEOUS | Status: DC
  Administered 2014-12-26 – 2014-12-27 (×2): 28 [IU] via SUBCUTANEOUS
  Filled 2014-12-26 (×2): qty 0.28

## 2014-12-26 NOTE — Progress Notes (Signed)
[place] TEAM 1 - Stepdown/ICU TEAM Progress Note  GRISELDA TOSH CHY:[id number] DOB: 12-21-1945 DOA: 12/21/2014 PCP: No primary care provider on file.  Admit HPI / Brief Narrative: 69 y.o. 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 Was just admitted to hospital from 8/20-8/24 for status epilepticus. During that stay he was intubated and up in the ICU due to status epilepticus. Ultimately discharged from hospital 8/24 to rehab facility. Since getting to rehab facility he has had decline. Numerous falls yesterday, generalized weakness, increased confusion. He was brought back to the ED this evening.   HPI/Subjective: 8/30 A/O 4 though somewhat confused. Patient and daughter requested know when patient would be stable for discharge.     Assessment/Plan: Sepsis unspecified organism -Continue antibiotics -Patient has just completed course of whole brain XRT + high-dose steroid-induced. Continue patient's current Decadron dose 4 mg daily   -Flutter valve  -Mucinex -DM  BID -Titrate O2 to maintain SPO2 89-93%  Diarrhea - ? ABX induced - C. 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 -Spoke with daughter who will bring oncology records from [place] -Patient just completed whole brain XRT, scheduled for chemotherapy to start next week. -Could be discharged in a.m. if all DME equipment can be delivered  Pulmonary hypertension -By echocardiogram  -Currently patient doing well would not add any additional medication at this time.   Diabetes type 2 uncontrolled -Increase Lantus 28 units daily -Continue Resistant SSI  Hyponatremia - ?SIADH related to lung CA - appears to vary between 125-130 - Na remains stable ~ 125 presently   Hypomagnesemia -Obtain magnesium in a.m.  Goals of care -Daughter is  helping to arrange in-home care for father working with [facility] .   Code Status: FULL Family Communication: Daughter present at time of exam Disposition Plan: Discharge in 24-48 hrs.    Consultants:   Procedure/Significant Events: 8/27 echocardiogram;- LVEF= 55%- 60%.  - Pulmonary arteries: PA peak pressure: 41 mm Hg (S).   Culture 8/25 blood left/right arm NGTD 8/25 urine multiple species request recollection  8/26 sputum negative 8/27 urine positive yeast   Antibiotics: Levofloxacin 8/25>> stopped 8/28 Vancomycin 8/25>> stopped 8/20   DVT prophylaxis: Lovenox   Devices Right chest wall port   LINES / TUBES:      Continuous Infusions:    Objective: VITAL SIGNS: Temp: 98.1 F (36.7 C) (08/30 2007) Temp Source: Oral (08/30 2007) BP: 134/59 mmHg (08/30 2007) Pulse Rate: 88 (08/30 2007) SPO2; FIO2:   Intake/Output Summary (Last 24 hours) at 12/26/14 2109 Last data filed at 12/26/14 1834  Gross per 24 hour  Intake    240 ml  Output   2850 ml  Net  -2610 ml     Exam: General:  alert, some confusion, cachectic, No acute respiratory distress Eyes: Negative headache, eye pain, double vision,negative scleral hemorrhage ENT: Negative Runny nose, negative ear pain, negative tinnitus, negative gingival bleeding Neck:  Negative scars, masses, torticollis, lymphadenopathy, JVD Lungs: Clear to auscultation bilaterally without wheezes or crackles Cardiovascular:  tachycardic, Regular rhythm without murmur gallop or rub normal S1 and S2 Abdomen:negative abdominal pain, negative dysphagia, nondistended, positive soft, bowel sounds, no rebound, no ascites, no appreciable mass Extremities: No significant cyanosis, clubbing. Bilateral pedal edema foot- mid calf 1+. Multiple bruises on bilateral and shins Psychiatric:  Negative  depression, negative anxiety, negative fatigue, negative mania Neurologic:  Cranial nerves II through XII intact, tongue/uvula  midline, all extremities muscle strength 5/5, sensation intact throughout,  negative dysarthria, negative expressive aphasia, negative receptive aphasia.    Data Reviewed: Basic Metabolic Panel:  Recent Labs Lab 12/21/14 1910 12/22/14 0218 12/22/14 1200 12/24/14 0555 12/25/14 0400 12/26/14 0219  NA 125* 125*  --  125* 125* 123*  K 4.1 3.7  --  4.1 4.4 4.5  CL 90* 92*  --  89* 88* 85*  CO2 26 26  --  '28 29 28  '$ GLUCOSE 209* 154*  --  227* 317* 439*  BUN 16 12  --  14 17 24*  CREATININE 0.77 0.70  --  0.53* 0.67 0.72  CALCIUM 8.5* 7.7*  --  8.0* 8.0* 7.9*  MG  --   --  1.3* 1.3* 1.5*  --    Liver Function Tests:  Recent Labs Lab 12/21/14 1910 12/26/14 0219  AST 27 21  ALT 41 37  ALKPHOS 133* 156*  BILITOT 0.9 0.5  PROT 5.7* 5.5*  ALBUMIN 1.6* 1.4*   No results for input(s): LIPASE, AMYLASE in the last 168 hours. No results for input(s): AMMONIA in the last 168 hours. CBC:  Recent Labs Lab 12/21/14 1910 12/22/14 0218 12/24/14 0555 12/25/14 0400 12/26/14 0219  WBC 20.2* 17.8* 17.1* 13.9* 15.3*  NEUTROABS 17.2*  --   --   --   --   HGB 9.6* 8.4* 7.8* 7.7* 8.4*  HCT 28.7* 25.7* 23.9* 23.6* 26.3*  MCV 75.5* 75.6* 75.6* 74.9* 74.9*  PLT 301 261 237 291 333   Cardiac Enzymes: No results for input(s): CKTOTAL, CKMB, CKMBINDEX, TROPONINI in the last 168 hours. BNP (last 3 results) No results for input(s): BNP in the last 8760 hours.  ProBNP (last 3 results) No results for input(s): PROBNP in the last 8760 hours.  CBG:  Recent Labs Lab 12/25/14 1602 12/25/14 2117 12/26/14 0813 12/26/14 1247 12/26/14 1741  GLUCAP 324* 362* 277* 285* 245*    Recent Results (from the past 240 hour(s))  Culture, blood (routine x 2)     Status: None   Collection Time: 12/21/14  7:21 PM  Result Value Ref Range Status   Specimen Description BLOOD LEFT ARM  Final   Special Requests BOTTLES DRAWN AEROBIC AND ANAEROBIC 5CC  Final   Culture NO GROWTH 5 DAYS  Final   Report  Status 12/26/2014 FINAL  Final  Culture, blood (routine x 2)     Status: None   Collection Time: 12/21/14  7:24 PM  Result Value Ref Range Status   Specimen Description BLOOD RIGHT ARM  Final   Special Requests BOTTLES DRAWN AEROBIC AND ANAEROBIC 10CC  Final   Culture NO GROWTH 5 DAYS  Final   Report Status 12/26/2014 FINAL  Final  Urine culture     Status: None   Collection Time: 12/21/14 10:53 PM  Result Value Ref Range Status   Specimen Description URINE, CATHETERIZED  Final   Special Requests NONE  Final   Culture MULTIPLE SPECIES PRESENT, SUGGEST RECOLLECTION  Final   Report Status 12/22/2014 FINAL  Final  Culture, expectorated sputum-assessment     Status: None   Collection Time: 12/22/14  1:52 PM  Result Value Ref Range Status   Specimen Description EXPECTORATED SPUTUM  Final   Special Requests Immunocompromised  Final   Sputum evaluation   Final    THIS SPECIMEN IS ACCEPTABLE. RESPIRATORY CULTURE REPORT TO FOLLOW.  Report Status 12/22/2014 FINAL  Final  Culture, respiratory (NON-Expectorated)     Status: None   Collection Time: 12/22/14  1:52 PM  Result Value Ref Range Status   Specimen Description SPUTUM  Final   Special Requests NONE  Final   Gram Stain   Final    FEW WBC PRESENT, PREDOMINANTLY MONONUCLEAR RARE SQUAMOUS EPITHELIAL CELLS PRESENT FEW GRAM POSITIVE COCCI IN PAIRS IN CLUSTERS RARE GRAM POSITIVE RODS RARE GRAM NEGATIVE RODS    Culture   Final    NORMAL OROPHARYNGEAL FLORA Performed at [facility]    Report Status 12/25/2014 FINAL  Final  Urine culture     Status: None   Collection Time: 12/23/14  9:55 PM  Result Value Ref Range Status   Specimen Description URINE, CLEAN CATCH  Final   Special Requests NONE  Final   Culture >=100,000 COLONIES/mL YEAST  Final   Report Status 12/25/2014 FINAL  Final     Studies:  Recent x-ray studies have been reviewed in detail by the Attending Physician  Scheduled Meds:  Scheduled Meds: .  atorvastatin  40 mg Oral q1800  . clopidogrel  75 mg Oral Daily  . dexamethasone  4 mg Oral QID  . dextromethorphan-guaiFENesin  1 tablet Oral BID  . enoxaparin (LOVENOX) injection  40 mg Subcutaneous Q24H  . feeding supplement (GLUCERNA SHAKE)  237 mL Oral TID BM  . gabapentin  300 mg Oral BID  . insulin aspart  0-20 Units Subcutaneous TID WC  . insulin aspart  0-5 Units Subcutaneous QHS  . insulin glargine  28 Units Subcutaneous Daily  . levETIRAcetam  1,500 mg Oral BID  . polyvinyl alcohol  1 drop Both Eyes QID  . sodium chloride  3 mL Intravenous Q12H    Time spent on care of this patient: 40 mins   WOODS, Geraldo Docker , MD  [facility] Office  [phone] Pager - [phone]  On-Call/Text Page:      [email or web address]      password [email or web address]  If 7PM-7AM, please contact night-coverage [email or web address] Password [email or web address] 12/26/2014, 9:09 PM   LOS: 5 days   Care during the described time interval was provided by me .  I have reviewed this patient's available data, including medical history, events of note, physical examination, and all test results as part of my evaluation. I have personally reviewed and interpreted all radiology studies.   Dia Crawford, MD [phone] Pager

## 2014-12-27 LAB — COMPREHENSIVE METABOLIC PANEL
ALK PHOS: 161 U/L — AB (ref 38–126)
ALT: 38 U/L (ref 17–63)
AST: 25 U/L (ref 15–41)
Albumin: 1.5 g/dL — ABNORMAL LOW (ref 3.5–5.0)
Anion gap: 10 (ref 5–15)
BUN: 22 mg/dL — AB (ref 6–20)
CALCIUM: 8.7 mg/dL — AB (ref 8.9–10.3)
CHLORIDE: 90 mmol/L — AB (ref 101–111)
CO2: 29 mmol/L (ref 22–32)
CREATININE: 0.67 mg/dL (ref 0.61–1.24)
GFR calc non Af Amer: >60 mL/min (ref >60)
GLUCOSE: 312 mg/dL — AB (ref 65–99)
Potassium: 5.3 mmol/L — ABNORMAL HIGH (ref 3.5–5.1)
SODIUM: 129 mmol/L — AB (ref 135–145)
Total Bilirubin: 0.6 mg/dL (ref 0.3–1.2)
Total Protein: 6.1 g/dL — ABNORMAL LOW (ref 6.5–8.1)

## 2014-12-27 LAB — GLUCOSE, CAPILLARY
GLUCOSE-CAPILLARY: 294 mg/dL — AB (ref 65–99)
GLUCOSE-CAPILLARY: 215 mg/dL — AB (ref 65–99)
GLUCOSE-CAPILLARY: 450 mg/dL — AB (ref 65–99)
Glucose-Capillary: 286 mg/dL — ABNORMAL HIGH (ref 65–99)

## 2014-12-27 LAB — CBC WITH DIFFERENTIAL/PLATELET
BASOS PCT: 0 % (ref 0–1)
Basophils Absolute: 0 10*3/uL (ref 0.0–0.1)
EOS ABS: 0 10*3/uL (ref 0.0–0.7)
EOS PCT: 0 % (ref 0–5)
HCT: 28.7 % — ABNORMAL LOW (ref 39.0–52.0)
Hemoglobin: 9.1 g/dL — ABNORMAL LOW (ref 13.0–17.0)
LYMPHS ABS: 0.8 10*3/uL (ref 0.7–4.0)
Lymphocytes Relative: 4 % — ABNORMAL LOW (ref 12–46)
MCH: 23.8 pg — AB (ref 26.0–34.0)
MCHC: 31.7 g/dL (ref 30.0–36.0)
MCV: 75.1 fL — AB (ref 78.0–100.0)
MONO ABS: 0.8 10*3/uL (ref 0.1–1.0)
Monocytes Relative: 4 % (ref 3–12)
NEUTROS ABS: 18.1 10*3/uL — AB (ref 1.7–7.7)
NEUTROS PCT: 92 % — AB (ref 43–77)
PLATELETS: 349 10*3/uL (ref 150–400)
RBC: 3.82 MIL/uL — ABNORMAL LOW (ref 4.22–5.81)
RDW: 19.3 % — ABNORMAL HIGH (ref 11.5–15.5)
WBC: 19.7 10*3/uL — ABNORMAL HIGH (ref 4.0–10.5)

## 2014-12-27 LAB — MAGNESIUM: MAGNESIUM: 1.6 mg/dL — AB (ref 1.7–2.4)

## 2014-12-27 MED ORDER — MAGNESIUM SULFATE 2 GM/50ML IV SOLN
2.0000 g | Freq: Once | INTRAVENOUS | Status: AC
  Administered 2014-12-27: 2 g via INTRAVENOUS
  Filled 2014-12-27: qty 50

## 2014-12-27 MED ORDER — INSULIN GLARGINE 100 UNIT/ML ~~LOC~~ SOLN
32.0000 [IU] | Freq: Two times a day (BID) | SUBCUTANEOUS | Status: DC
  Administered 2014-12-27 – 2014-12-28 (×2): 32 [IU] via SUBCUTANEOUS
  Filled 2014-12-27 (×3): qty 0.32

## 2014-12-27 MED ORDER — INSULIN ASPART 100 UNIT/ML ~~LOC~~ SOLN
10.0000 [IU] | Freq: Once | SUBCUTANEOUS | Status: AC
  Administered 2014-12-27: 10 [IU] via SUBCUTANEOUS

## 2014-12-27 MED ORDER — FLUCONAZOLE 100 MG PO TABS
100.0000 mg | ORAL_TABLET | Freq: Every day | ORAL | Status: AC
  Administered 2014-12-28 – 2014-12-31 (×4): 100 mg via ORAL
  Filled 2014-12-27 (×4): qty 1

## 2014-12-27 MED ORDER — FLUCONAZOLE 200 MG PO TABS
200.0000 mg | ORAL_TABLET | Freq: Once | ORAL | Status: AC
  Administered 2014-12-27: 200 mg via ORAL
  Filled 2014-12-27: qty 1

## 2014-12-27 MED ORDER — DM-GUAIFENESIN ER 30-600 MG PO TB12
1.0000 | ORAL_TABLET | Freq: Two times a day (BID) | ORAL | Status: DC | PRN
  Administered 2014-12-29: 1 via ORAL
  Filled 2014-12-27 (×2): qty 1
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
## 2014-12-27 NOTE — Progress Notes (Signed)
Inpatient Diabetes Program Recommendations  AACE/ADA: New Consensus Statement on Inpatient Glycemic Control (2013)  Target Ranges:  Prepandial:   less than 140 mg/dL      Peak postprandial:   less than 180 mg/dL (1-2 hours)      Critically ill patients:  140 - 180 mg/dL    Inpatient Diabetes Program Recommendations Insulin - Basal: Glucose remains elevated.most probably due to decadron. Please consider again another increase in lantus to 35 units bid - avoid high doses of correction.  Once fasting glucose levels are less than 200, if post-prandials remain high, may need start some meal coverage.   Thank you Rosita Kea, RN, MSN, CDE  Diabetes Inpatient Program Office: [phone] Pager: [phone] 8:00 am to 5:00 pm

## 2014-12-27 NOTE — Progress Notes (Signed)
Pt daughter concerned about pt having seizures. RN called to the room to visualize pt having a possible seizure. Upon entering the room RN saw daughter recording on cell phone what she thought was possible seizure activity. RN noticed pt was holding cell phone to ear using the left hand and noticed pt having tremors of the arm and slight drifting downward. Spoke with daughter previously about the same concern and asked pt to hold phone in right hand to see if pt was having the same activity. While holding the phone in right hand pt showed signs of the same activity as he did with the left arm. RN also asked pt if he felt very weak trying to hold the phone and he stated yes. Dr. Thereasa Solo paged and asked to come speak with daughter. Daughter notified of RN paging MD. Will continue to monitor

## 2014-12-27 NOTE — Progress Notes (Signed)
Physical Therapy Treatment Patient Details Name: Frank Krueger MRN: [id number] DOB: 1946/02/16 Today's Date: 12/27/2014    History of Present Illness 69 y/o man with stage IV lung adeno CA admitted with sepsis and AMS after recent D/C 8/24 to SNF after admission for seizures. PMHx of HTN, cerebral aneurysm (with rupture), COPD, SDH, scoliosis of lumbar spine, lung CA with brain mets, and left crainiotomy.     PT Comments    Patient in bed, eager to participate in PT today. Patient was able to ambulate and transfer as described below. He has decreased awareness of need for safety with ambulation. Patient requested use of BSC at end of session and stated that it may take him "15-20 minutes." RN was notified of patient position as well as mobility status, and patient was repeatedly educated on use of call button to return to the chair. Patient will benefit from continued PT to increase ambulatory stability and independence.    Follow Up Recommendations  Supervision/Assistance - 24 hour;Home health PT     Equipment Recommendations  Rolling walker with 5" wheels    Recommendations for Other Services       Precautions / Restrictions Precautions Precautions: Fall Restrictions Weight Bearing Restrictions: No    Mobility  Bed Mobility Overal bed mobility: Modified Independent Bed Mobility: Supine to Sit     Supine to sit: Modified independent (Device/Increase time)     General bed mobility comments: Able to pull himself to sitting without physical assistance.  Transfers Overall transfer level: Needs assistance Equipment used: Rolling walker (2 wheeled) Transfers: Sit to/from Stand Sit to Stand: Min guard;From elevated surface         General transfer comment: Cues for hand placement, unable to stand without elevated surface.   Ambulation/Gait Ambulation/Gait assistance: Min guard Ambulation Distance (Feet): 200 Feet Assistive device: Rolling walker (2 wheeled) Gait  Pattern/deviations: Step-through pattern;Decreased stride length Gait velocity: Wants to walk faster than is safe for him at this time. Gait velocity interpretation: at or above normal speed for age/gender General Gait Details: Patient required min VC's to maintain feet within support of RW, said "oh yes" when reminded for safety. Repeatedly denies need for physical assistance - "I've got it/I'm fine", but not frustrated. One episode of staggering where patient ran into therapist, but was able to catch himself with min guard assist and stopped for a second to regain balance.    Stairs            Wheelchair Mobility    Modified Rankin (Stroke Patients Only)       Balance Overall balance assessment: Needs assistance Sitting-balance support: Feet supported;Bilateral upper extremity supported Sitting balance-Leahy Scale: Good     Standing balance support: Bilateral upper extremity supported Standing balance-Leahy Scale: Fair                      Cognition Arousal/Alertness: Awake/alert Behavior During Therapy: Impulsive Overall Cognitive Status: No family/caregiver present to determine baseline cognitive functioning           Safety/Judgement: Decreased awareness of safety;Decreased awareness of deficits     General Comments: Patient does not seem confused at all today, able to hold normal conversation. May be hard of hearing. Still decreased safety and awareness of deficits.    Exercises      General Comments        Pertinent Vitals/Pain Pain Assessment: No/denies pain    Home Living  Prior Function            PT Goals (current goals can now be found in the care plan section) Acute Rehab PT Goals Patient Stated Goal: Go home, take a walk outside PT Goal Formulation: With patient Time For Goal Achievement: 01/07/15 Potential to Achieve Goals: Good Progress towards PT goals: Progressing toward goals    Frequency   Min 2X/week    PT Plan Current plan remains appropriate    Co-evaluation             End of Session Equipment Utilized During Treatment: Gait belt Activity Tolerance: Patient tolerated treatment well Patient left: with call bell/phone within reach;with chair alarm set;Other (comment) (Using BSC, pt reports needing long time to use, RN notified.)     Time: 1478-2956 PT Time Calculation (min) (ACUTE ONLY): 26 min  Charges:  $Gait Training: 8-22 mins $Therapeutic Activity: 8-22 mins                    G CodesRoanna Epley, SPT [phone] 12/27/2014, 12:42 PM  I have read, reviewed and agree with student's note.   [place] [phone] (pager)

## 2014-12-27 NOTE — Care Management Note (Signed)
Case Management Note  Patient Details  Name: SAHMIR WEATHERBEE MRN: [id number] Date of Birth: 1945-09-28  Subjective/Objective:    Lennette Bihari, SW @ [facility] returned call.  Discussed dtr's request for increased aide hours for pt.  Per SW, aide will visit 2 hrs/day, 3 days/wk, and [facility] to [facility] additional hours.  Information provided to dtr who states she will arrange for friends and other family members to stay with pt during the day while she works.                               Expected Discharge Plan:  [place]  CM Consult  Post Acute Care Choice:  Durable Medical Equipment  DME Arranged:  3-N-1, Walker rolling DME Agency:  [place] Arranged:  RN, PT, OT, Speech Therapy, Nurse's Aide [facility] Agency:  [facility] [place], [place] 12/27/2014, 8:02 AM

## 2014-12-27 NOTE — Progress Notes (Signed)
[place] TEAM 1 - Stepdown/ICU TEAM PROGRESS NOTE  Frank Krueger ASN:[id number] DOB: June 22, 1945 DOA: 12/21/2014 PCP: No primary care provider on file.  Admit HPI / Brief Narrative: 69 y.o. 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 During that stay he was intubated. Ultimately discharged from hospital 8/24 to a rehab facility. At the rehab facility he was noted to have progressive confusion and was brought back to the ED for further evaluation.  HPI/Subjective: The patient is sitting in a bedside chair.  He is alert and oriented though mildly confused.  He is fixated on going home.  He denies chest pain shortness breath fevers chills nausea or vomiting.  Assessment/Plan:  Sepsis - source unclear  - UA suggestive of UTI, urine culture with multiple species and recollection notes only yeast (usually not a pathogen)  - stop abx tx today and follow clinically - repeat UA 8/30 again noting multiple yeast - will dose w/ short course of diflucan given need for ongoing high dose immunosuppressant  - pt improving clinically   Intermittent ICU delirium - Mental status waxes and wanes mildly but overall the patient is improving nicely  Diabetes type 2 -  severely uncontrolled  - CBG severely elevated in setting of ongoing high dose steroids therapy - presently it is not felt to be safe to taper his steroids therefore we will increase his insulin dosing significantly  - the patient was not previously on insulin but will clearly need to go home on insulin as long as Decadron is continued   Diarrhea - resolved - pt is now having formed stools   Nausea and vomiting  - resolved - tolerating diet well  - ABX XRAY with no signs of obstruction   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  - Patient has just completed course of whole brain XRT (?last tx 8/15) + high-dose  steroid  - Continue patient's current Decadron dose 4 mg daily(family reports that prior attempts to wean to 42m were met w/ seizure activity)  - chemotherapy scheduled next week   Seizure disorder - Continue Keppra 1500 mg BID  Anemia of chronic disease, malignancy - no signs of active bleeding - Hgb is now stabilizing/improving - transfuse for Hg < 7  Hyponatremia - ?SIADH related to lung CA - appears to vary between 125-130 - likely also a component of pseudohyponatremia due to severe hyperglycemia - Na has improved to 129 today   Hypomagnesemia - Likely due to poor oral intake - continue to supplement with goal 2.0  Moderate pulmonary hypertension - normal EF per TTE 12/23/14 - weight is 77.8 kg which is down from 78.8 kg on admission - monitor daily weights and strict I/O  Code Status: FULL Family Communication: Spoke with daughter at bedside at length  Disposition Plan: SDU  Consultants: None  Procedures: None  Antibiotics: Vancomycin 8/25 > 8/28 Levaquin 8/25 > 8/28  DVT prophylaxis: Lovenox  Objective: Blood pressure 127/68, pulse 95, temperature 98.6 F (37 C), temperature source Oral, resp. rate 29, height _0  (1.854 m), weight 77.8 kg (171 lb 8.3 oz), SpO2 100 %.  Intake/Output Summary (Last 24 hours) at 12/27/14 1135 Last data filed at 12/27/14 0900  Gross per 24 hour  Intake   1323 ml  Output   1750 ml  Net   -427 ml   Exam: General:  No acute respiratory distress - alert and conversant Lungs: Clear to auscultation bilaterally without wheezes or crackles Cardiovascular: Regular rate and rhythm without murmur gallop or rub  Abdomen: Nontender, nondistended, soft, bowel sounds positive, no rebound, no ascites, no appreciable mass Extremities: No significant cyanosis, clubbing, edema bilateral lower extremities  Data Reviewed: Basic Metabolic Panel:  Recent Labs Lab 12/22/14 0218 12/22/14 1200 12/24/14 0555 12/25/14 0400 12/26/14 0219  12/27/14 0259  NA 125*  --  125* 125* 123* 129*  K 3.7  --  4.1 4.4 4.5 5.3*  CL 92*  --  89* 88* 85* 90*  CO2 26  --  _0 GLUCOSE 154*  --  227* 317* 439* 312*  BUN 12  --  14 17 24* 22*  CREATININE 0.70  --  0.53* 0.67 0.72 0.67  CALCIUM 7.7*  --  8.0* 8.0* 7.9* 8.7*  MG  --  1.3* 1.3* 1.5*  --  1.6*    CBC:  Recent Labs Lab 12/21/14 1910 12/22/14 0218 12/24/14 0555 12/25/14 0400 12/26/14 0219 12/27/14 0259  WBC 20.2* 17.8* 17.1* 13.9* 15.3* 19.7*  NEUTROABS 17.2*  --   --   --   --  18.1*  HGB 9.6* 8.4* 7.8* 7.7* 8.4* 9.1*  HCT 28.7* 25.7* 23.9* 23.6* 26.3* 28.7*  MCV 75.5* 75.6* 75.6* 74.9* 74.9* 75.1*  PLT 301 261 237 291 333 349    Liver Function Tests:  Recent Labs Lab 12/21/14 1910 12/26/14 0219 12/27/14 0259  AST _1 ALT 41 37 38  ALKPHOS 133* 156* 161*  BILITOT 0.9 0.5 0.6  PROT 5.7* 5.5* 6.1*  ALBUMIN 1.6* 1.4* 1.5*   CBG:  Recent Labs Lab 12/26/14 0813 12/26/14 1247 12/26/14 1741 12/26/14 2236 12/27/14 0825  GLUCAP 277* 285* 245* 384* 294*    Recent Results (from the past 240 hour(s))  Culture, blood (routine x 2)     Status: None   Collection Time: 12/21/14  7:21 PM  Result Value Ref Range Status   Specimen Description BLOOD LEFT ARM  Final   Special Requests BOTTLES DRAWN AEROBIC AND ANAEROBIC 5CC  Final   Culture NO GROWTH 5 DAYS  Final   Report Status 12/26/2014 FINAL  Final  Culture, blood (routine x 2)     Status: None   Collection Time: 12/21/14  7:24 PM  Result Value Ref Range Status   Specimen Description BLOOD RIGHT ARM  Final   Special Requests BOTTLES DRAWN AEROBIC AND ANAEROBIC 10CC  Final   Culture NO GROWTH 5 DAYS  Final   Report Status 12/26/2014 FINAL  Final  Urine culture     Status: None   Collection Time: 12/21/14 10:53 PM  Result Value Ref Range Status   Specimen Description URINE, CATHETERIZED  Final   Special Requests NONE  Final   Culture MULTIPLE SPECIES PRESENT, SUGGEST RECOLLECTION   Final   Report Status 12/22/2014 FINAL  Final  Culture, expectorated sputum-assessment     Status: None   Collection Time: 12/22/14  1:52 PM  Result Value Ref Range Status   Specimen Description EXPECTORATED SPUTUM  Final   Special Requests Immunocompromised  Final   Sputum evaluation   Final    THIS SPECIMEN IS ACCEPTABLE. RESPIRATORY CULTURE REPORT TO FOLLOW.   Report Status 12/22/2014 FINAL  Final  Culture, respiratory (NON-Expectorated)     Status: None   Collection Time: 12/22/14  1:52 PM  Result Value Ref Range Status   Specimen Description  SPUTUM  Final   Special Requests NONE  Final   Gram Stain   Final    FEW WBC PRESENT, PREDOMINANTLY MONONUCLEAR RARE SQUAMOUS EPITHELIAL CELLS PRESENT FEW GRAM POSITIVE COCCI IN PAIRS IN CLUSTERS RARE GRAM POSITIVE RODS RARE GRAM NEGATIVE RODS    Culture   Final    NORMAL OROPHARYNGEAL FLORA Performed at [facility]    Report Status 12/25/2014 FINAL  Final  Urine culture     Status: None   Collection Time: 12/23/14  9:55 PM  Result Value Ref Range Status   Specimen Description URINE, CLEAN CATCH  Final   Special Requests NONE  Final   Culture >=100,000 COLONIES/mL YEAST  Final   Report Status 12/25/2014 FINAL  Final     Studies:   Recent x-ray studies have been reviewed in detail by the Attending Physician  Scheduled Meds:  Scheduled Meds: . atorvastatin  40 mg Oral q1800  . clopidogrel  75 mg Oral Daily  . dexamethasone  4 mg Oral QID  . dextromethorphan-guaiFENesin  1 tablet Oral BID  . enoxaparin (LOVENOX) injection  40 mg Subcutaneous Q24H  . feeding supplement (GLUCERNA SHAKE)  237 mL Oral TID BM  . gabapentin  300 mg Oral BID  . insulin aspart  0-20 Units Subcutaneous TID WC  . insulin aspart  0-5 Units Subcutaneous QHS  . insulin glargine  28 Units Subcutaneous Daily  . levETIRAcetam  1,500 mg Oral BID  . polyvinyl alcohol  1 drop Both Eyes QID  . sodium chloride  3 mL Intravenous Q12H    Time spent  on care of this patient: 35 mins   MCCLUNG,JEFFREY T , MD   [facility] Office  [phone] Pager - Text Page per Shea Evans as per below:  On-Call/Text Page:      [email or web address]      password [email or web address]  If 7PM-7AM, please contact night-coverage [email or web address] Password [email or web address] 12/27/2014, 11:35 AM   LOS: 6 days

## 2014-12-28 ENCOUNTER — Inpatient Hospital Stay (HOSPITAL_COMMUNITY): Payer: Non-veteran care

## 2014-12-28 LAB — GLUCOSE, CAPILLARY
Glucose-Capillary: 288 mg/dL — ABNORMAL HIGH (ref 65–99)
Glucose-Capillary: 245 mg/dL — ABNORMAL HIGH (ref 65–99)
Glucose-Capillary: 399 mg/dL — ABNORMAL HIGH (ref 65–99)
Glucose-Capillary: 358 mg/dL — ABNORMAL HIGH (ref 65–99)
Glucose-Capillary: 336 mg/dL — ABNORMAL HIGH (ref 65–99)

## 2014-12-28 LAB — BASIC METABOLIC PANEL
ANION GAP: 12 (ref 5–15)
BUN: 17 mg/dL (ref 6–20)
CHLORIDE: 83 mmol/L — AB (ref 101–111)
CO2: 26 mmol/L (ref 22–32)
CREATININE: 0.59 mg/dL — AB (ref 0.61–1.24)
Calcium: 8.5 mg/dL — ABNORMAL LOW (ref 8.9–10.3)
GFR calc non Af Amer: >60 mL/min (ref >60)
GLUCOSE: 284 mg/dL — AB (ref 65–99)
Potassium: 4.6 mmol/L (ref 3.5–5.1)
Sodium: 121 mmol/L — ABNORMAL LOW (ref 135–145)

## 2014-12-28 LAB — MAGNESIUM: MAGNESIUM: 1.7 mg/dL (ref 1.7–2.4)

## 2014-12-28 MED ORDER — INSULIN ASPART 100 UNIT/ML ~~LOC~~ SOLN
5.0000 [IU] | Freq: Once | SUBCUTANEOUS | Status: AC
  Administered 2014-12-28: 5 [IU] via SUBCUTANEOUS

## 2014-12-28 MED ORDER — INSULIN ASPART 100 UNIT/ML FLEXPEN: 10.0000 [IU] | PEN_INJECTOR | Freq: Three times a day (TID) | SUBCUTANEOUS | Status: DC

## 2014-12-28 MED ORDER — WHITE PETROLATUM GEL
Status: AC
  Administered 2014-12-28: 0.2
  Filled 2014-12-28: qty 1

## 2014-12-28 MED ORDER — INSULIN GLARGINE 100 UNIT/ML ~~LOC~~ SOLN
38.0000 [IU] | Freq: Every day | SUBCUTANEOUS | Status: DC
  Administered 2014-12-28 – 2014-12-29 (×2): 38 [IU] via SUBCUTANEOUS
  Filled 2014-12-28 (×2): qty 0.38

## 2014-12-28 MED ORDER — INSULIN GLARGINE 100 UNIT/ML ~~LOC~~ SOLN: 38.0000 [IU] | Freq: Every day | SUBCUTANEOUS | Status: DC

## 2014-12-28 MED ORDER — INSULIN PEN NEEDLE 29G X 12MM MISC: 38.0000 [IU] | Freq: Every day | Status: DC

## 2014-12-28 MED ORDER — INSULIN GLARGINE 100 UNIT/ML SOLOSTAR PEN: 38.0000 [IU] | PEN_INJECTOR | Freq: Every day | SUBCUTANEOUS | Status: DC

## 2014-12-28 MED ORDER — STARCH (THICKENING) PO POWD: ORAL | Status: DC

## 2014-12-28 MED ORDER — INSULIN ASPART 100 UNIT/ML ~~LOC~~ SOLN: 10.0000 [IU] | Freq: Three times a day (TID) | SUBCUTANEOUS | Status: DC

## 2014-12-28 MED ORDER — FLUCONAZOLE 100 MG PO TABS: 100.0000 mg | ORAL_TABLET | Freq: Every day | ORAL | Status: DC

## 2014-12-28 MED ORDER — LEVETIRACETAM 750 MG PO TABS: 1500.0000 mg | ORAL_TABLET | Freq: Two times a day (BID) | ORAL | Status: DC

## 2014-12-28 MED ORDER — INSULIN GLARGINE 100 UNIT/ML ~~LOC~~ SOLN
38.0000 [IU] | Freq: Two times a day (BID) | SUBCUTANEOUS | Status: DC
  Filled 2014-12-28: qty 0.38

## 2014-12-28 NOTE — Progress Notes (Signed)
Speech Pathology   MBSS complete. Full report located under chart review in imaging section. Click on DG swallow function. Recommend Dys 3 texture and nectar thick liquids   Orbie Pyo [email or web address] [phone]

## 2014-12-28 NOTE — Discharge Summary (Addendum)
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     hx  . Scoliosis of lumbar spine   . Spondylosis   . Brain cancer   . Lung  cancer 02/03/13  . 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 DM2 (diabetes mellitus, type 2) 12/21/2014   Past Surgical History:  Past Surgical History  Procedure Laterality Date  . Hand surgery    . Mandible fracture surgery    . Appendectomy    . Craniotomy Left 2000  . 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 During this admission, pt was evaluated and recommended to  have Dys 3 diet and nectar thick liquids due to coughing with thin  liquids. PMHx of HTN, cerebral aneurysm (with rupture), COPD, SDH,  scoliosis of lumbar spine, lung CA with brain mets, and left crainiotomy.  MBS recommended to assess full swallow function.   No Data Recorded  Assessment / Plan / Recommendation CHL IP CLINICAL IMPRESSIONS 12/28/2014  Therapy Diagnosis (None)  Clinical Impression Mild-mod oropharyngeal dysphagia with decreased  sensation leading to delayed swallow. Aspiration during the  swallow of  thin with cognitive deficits preventing reliability of performing chin  tuck. Nectar thick liquids did not enter pt's laryngeal vestibule during  study. Vallecular and pyriform sinsus residue present (mild-mod).  Recommend Dys 3 texture (able to masticate with gums), nectar thick  liquids, crush pills and full supervision.      CHL IP TREATMENT RECOMMENDATION 12/28/2014  Treatment Recommendations Therapy as outlined in treatment plan below     CHL IP DIET RECOMMENDATION 12/28/2014  SLP Diet Recommendations Dysphagia 3 (Mech soft);Nectar  Liquid Administration via (None)  Medication Administration Whole meds with puree  Compensations Slow rate;Small sips/bites  Postural Changes and/or Swallow Maneuvers (None)     CHL IP OTHER RECOMMENDATIONS 12/28/2014  Recommended Consults (None)  Oral Care Recommendations Oral care BID  Other Recommendations Order thickener from pharmacy     CHL IP FOLLOW UP RECOMMENDATIONS 12/19/2014  Follow up Recommendations Skilled Nursing facility     [facility] IP FREQUENCY AND DURATION 12/28/2014  Speech Therapy Frequency (ACUTE ONLY) min 2x/week  Treatment Duration 2 weeks     Pertinent Vitals/Pain none    SLP Swallow Goals No flowsheet data found.  No flowsheet data found.    CHL IP REASON FOR REFERRAL 12/28/2014  Reason for Referral Objectively evaluate swallowing function               No flowsheet data found.         Houston Siren 12/28/2014, 2:40 PM   Orbie Pyo Colvin Caroli.Ed CCC-SLP Pager [phone]       Microbiology: Recent Results (from the past 240 hour(s))  Urine culture     Status: None   Collection Time: 12/23/14  9:55 PM  Result Value Ref Range Status   Specimen Description URINE, CLEAN CATCH  Final   Special Requests NONE  Final   Culture >=100,000 COLONIES/mL YEAST  Final   Report Status 12/25/2014 FINAL  Final  Culture, Urine     Status: None   Collection Time: 12/27/14  4:59 PM  Result Value Ref Range Status   Specimen Description URINE, CLEAN CATCH  Final    Special Requests NONE  Final   Culture >=100,000 COLONIES/mL YEAST  Final   Report Status 12/29/2014 FINAL  Final     Labs: Basic Metabolic Panel:  Recent Labs Lab 12/27/14 0259 12/28/14 0223 12/31/14 0709 01/01/15 0348 01/02/15 0500  NA 129* 121* 123* 125* 126*  K  5.3* 4.6 4.5 4.8 4.8  CL 90* 83* 87* 88* 89*  CO2 _0 GLUCOSE 312* 284* 163* 219* 165*  BUN 22* _1 CREATININE 0.67 0.59* 0.54* 0.63 0.57*  CALCIUM 8.7* 8.5* 8.7* 8.4* 8.6*  MG 1.6* 1.7 1.4* 1.6* 1.6*   Liver Function Tests:  Recent Labs Lab 12/27/14 0259 01/01/15 0348 01/02/15 0500  AST 25 42* 50*  ALT 38 54 65*  ALKPHOS 161* 207* 226*  BILITOT 0.6 0.4 0.3  PROT 6.1* 5.8* 5.7*  ALBUMIN 1.5* 1.2* 1.2*   No results for input(s): LIPASE, AMYLASE in the last 168 hours. No results for input(s): AMMONIA in the last 168 hours. CBC:  Recent Labs Lab 12/27/14 0259 12/31/14 0709 01/01/15 0348 01/02/15 0500  WBC 19.7* 26.7* 27.1* 26.6*  NEUTROABS 18.1*  --  24.6*  --   HGB 9.1* 8.3* 8.7* 8.7*  HCT 28.7* 25.6* 26.9* 26.3*  MCV 75.1* 73.6* 73.9* 73.7*  PLT 349 463* 473* 429*   Cardiac Enzymes: No results for input(s): CKTOTAL, CKMB, CKMBINDEX, TROPONINI in the last 168 hours. BNP: BNP (last 3 results) No results for input(s): BNP in the last 8760 hours.  ProBNP (last 3 results) No results for input(s): PROBNP in the last 8760 hours.  CBG:  Recent Labs Lab 01/01/15 0814 01/01/15 1228 01/01/15 1657 01/01/15 2125 01/02/15 0807  GLUCAP 149* 225* 271* 240* 121*       Signed:  Florencia Reasons, MD PhD [facility] [phone] pager

## 2014-12-28 NOTE — Progress Notes (Signed)
Speech Language Pathology Treatment:    Patient Details Name: Frank Krueger MRN: [id number] DOB: 1945/06/02 Today's Date: 12/28/2014 Time: 0939-      MBS scheduled for today at 11:30.    Orbie Pyo [place].Ed [employer] [phone]

## 2014-12-28 NOTE — Care Management Note (Signed)
Case Management Note  Patient Details  Name: Frank Krueger MRN: [id number] Date of Birth: 25-Aug-1945  Subjective/Objective:   Discharge planned for tomorrow am.  Pt will need to be seen by Dr Armour @ [facility] before [place] will furnish medications needed after discharge.  Per [place] Lance Mock [phone] SW @ [facility], they will arrange ambulance transport from [facility] to [facility] @ 1:30 pm tomorrow.  Dr Francia Greaves will see pt and medications will be provided, ambulance will then transport pt home.  Discussed plan with dtr and pt.  CM to fax discharge summary to Clinic @ [phone].                             Expected Discharge Plan:  [place]  Discharge planning Services  CM Consult  Post Acute Care Choice:  Durable Medical Equipment   hDME Arranged:  3-N-1, Walker rolling, Hospital bed DME Agency:  [facility], [place] Arranged:  RN, PT, OT, Speech Therapy, Nurse's Aide [place] Agency:  [facility], [place]  Status of Service:  Completed, signed off   Girard Cooter, [place] 12/28/2014, 12:08 PM

## 2014-12-29 LAB — URINE CULTURE: Culture: >=100000

## 2014-12-29 LAB — GLUCOSE, CAPILLARY
GLUCOSE-CAPILLARY: 311 mg/dL — AB (ref 65–99)
GLUCOSE-CAPILLARY: 268 mg/dL — AB (ref 65–99)
GLUCOSE-CAPILLARY: 253 mg/dL — AB (ref 65–99)

## 2014-12-29 MED ORDER — HEPARIN SOD (PORK) LOCK FLUSH 100 UNIT/ML IV SOLN
500.0000 [IU] | INTRAVENOUS | Status: AC | PRN
  Administered 2014-12-29: 500 [IU]

## 2014-12-29 MED ORDER — INSULIN ASPART 100 UNIT/ML ~~LOC~~ SOLN: 14.0000 [IU] | Freq: Three times a day (TID) | SUBCUTANEOUS | Status: DC

## 2014-12-29 MED ORDER — INSULIN ASPART 100 UNIT/ML FLEXPEN
14.0000 [IU] | PEN_INJECTOR | Freq: Three times a day (TID) | SUBCUTANEOUS | Status: DC
Start: 2014-12-29 — End: 2015-01-06

## 2014-12-29 MED ORDER — INSULIN ASPART 100 UNIT/ML ~~LOC~~ SOLN
10.0000 [IU] | Freq: Three times a day (TID) | SUBCUTANEOUS | Status: DC
  Administered 2014-12-29 – 2015-01-02 (×11): 10 [IU] via SUBCUTANEOUS

## 2014-12-29 MED ORDER — INSULIN GLARGINE 100 UNIT/ML ~~LOC~~ SOLN
40.0000 [IU] | Freq: Two times a day (BID) | SUBCUTANEOUS | Status: DC
  Administered 2014-12-29 – 2015-01-02 (×8): 40 [IU] via SUBCUTANEOUS
  Filled 2014-12-29 (×9): qty 0.4

## 2014-12-29 MED ORDER — PHENOL 1.4 % MT LIQD
1.0000 | Freq: Two times a day (BID) | OROMUCOSAL | Status: DC | PRN
  Administered 2014-12-30: 1 via OROMUCOSAL
  Filled 2014-12-29: qty 177

## 2014-12-29 MED ORDER — INSULIN GLARGINE 100 UNIT/ML SOLOSTAR PEN: 40.0000 [IU] | PEN_INJECTOR | Freq: Two times a day (BID) | SUBCUTANEOUS | Status: DC

## 2014-12-29 NOTE — Progress Notes (Signed)
Inpatient Diabetes Program Recommendations  AACE/ADA: New Consensus Statement on Inpatient Glycemic Control (2013)  Target Ranges:  Prepandial:   less than 140 mg/dL      Peak postprandial:   less than 180 mg/dL (1-2 hours)      Critically ill patients:  140 - 180 mg/dL   Results for ROSHAD, HACK (MRN [id number]) as of 12/29/2014 08:55  Ref. Range 12/28/2014 04:26 12/28/2014 07:58 12/28/2014 12:59 12/28/2014 17:52 12/28/2014 22:10  Glucose-Capillary Latest Ref Range: 65-99 mg/dL 288 (H) 245 (H) 399 (H) 358 (H) 336 (H)    Current orders for Inpatient glycemic control: Lantus 38 units daily, Novolog 0-20 units TID with meals, Novolog 10 units TID with meals  Inpatient Diabetes Program Recommendations Insulin - Basal: Noted Lantus order was changed from Lantus 32 units BID to Lantus 38 units daily on 12/28/14. Patient will definitely require Lantus BID. Glucose ranged from 245-399 mg/dl on 12/28/14. Please consider increasing Lantus to 40 units BID. Insulin - Meal Coverage: Note Novolog 10 units TID with meals for meal coverage should start this morning. May want to increase to Novolog 15 units TID with meals if Decadron 4 mg QID will be continued.   Thanks, Barnie Alderman, RN, MSN, CCRN, CDE Diabetes Coordinator Inpatient Diabetes Program [phone] (Team Pager from [place] to [place]) [phone] ([place] office) [phone] [facility] office) [phone] [facility] office)

## 2014-12-29 NOTE — Progress Notes (Signed)
Speech Language Pathology Treatment: Dysphagia  Patient Details Name: Frank Krueger MRN: [id number] DOB: [birth year] Today's Date: 12/29/2014 Time: 7579-7282 SLP Time Calculation (min) (ACUTE ONLY): 10 min  Assessment / Plan / Recommendation Clinical Impression  Pt seen briefly with po's due to pt complaining of sleepiness, no appetite. Daughter reported he ate pudding for lunch and would not try other items. SLP encouraged him to eat more with dinner. Several sips nectar juice did not indicate difficulties. Pt afebrile without RN documentation of lung sound changes. Pt will likely remain on nectar thick liquids for some time given lung cancer, brain mets and daughter reports of coughing at home with liquids. Follow pt on acute care.    HPI Other Pertinent Information: 69 y/o man with stage IV lung adeno CA admitted with sepsis and AMS after recent D/C 8/24 to SNF after admission for seizures. During this admission, pt was evaluated and recommended to have Dys 3 diet and nectar thick liquids due to coughing with thin liquids. PMHx of HTN, cerebral aneurysm (with rupture), COPD, SDH, scoliosis of lumbar spine, lung CA with brain mets, and left crainiotomy. MBS recommended to assess full swallow function.    Pertinent Vitals Pain Assessment: No/denies pain  SLP Plan  Continue with current plan of care    Recommendations Diet recommendations: Nectar-thick liquid;Dysphagia 3 (mechanical soft) Liquids provided via: Cup Medication Administration: Whole meds with puree Supervision: Patient able to self feed;Full supervision/cueing for compensatory strategies Compensations: Slow rate;Small sips/bites Postural Changes and/or Swallow Maneuvers: Seated upright 90 degrees;Upright 30-60 min after meal              Oral Care Recommendations: Oral care BID Follow up Recommendations: Skilled Nursing facility Plan: Continue with current plan of care    GO     Houston Siren 12/29/2014, 2:41  PM  Orbie Pyo Colvin Caroli.Ed [employer] [phone]

## 2014-12-29 NOTE — Progress Notes (Signed)
Physical Therapy Treatment Patient Details Name: Frank Krueger MRN: [id number] DOB: Oct 30, 1945 Today's Date: 12/29/2014    History of Present Illness 69 y/o man with stage IV lung adeno CA admitted with sepsis and AMS after recent D/C 8/24 to SNF after admission for seizures. PMHx of HTN, cerebral aneurysm (with rupture), COPD, SDH, scoliosis of lumbar spine, lung CA with brain mets, and left crainiotomy.     PT Comments    Patient in bed, very eager to participate in PT today. Patient and daughter report that patient has not been up with nursing staff due to concerns of his glucose levels. Patient was able to ambulate and transfer as described below. See Vitals below to see pertinent vitals during session, specifically BP response. Patient will benefit from continued PT at an increasing amount (3x/week) to address obvious balance deficits and to counter functional decline due to lack of OOB.    Follow Up Recommendations  Supervision/Assistance - 24 hour;Home health PT     Equipment Recommendations  Rolling walker with 5" wheels    Recommendations for Other Services       Precautions / Restrictions Precautions Precautions: Fall Precaution Comments: impulsive with decreased safety awareness Restrictions Weight Bearing Restrictions: No    Mobility  Bed Mobility Overal bed mobility: Modified Independent Bed Mobility: Supine to Sit     Supine to sit: Modified independent (Device/Increase time)     General bed mobility comments: Bed mobility took much more effort today than previously. He was still able to get to sitting without physical assistance.   Transfers Overall transfer level: Needs assistance Equipment used: Rolling walker (2 wheeled) Transfers: Sit to/from Stand Sit to Stand: Min guard;From elevated surface         General transfer comment: Able to stand on second attempt with min guard assist.   Ambulation/Gait Ambulation/Gait assistance: Min  assist Ambulation Distance (Feet): 40 Feet Assistive device: Rolling walker (2 wheeled);1 person hand held assist Gait Pattern/deviations: Shuffle;Decreased stride length;Step-through pattern;Drifts right/left;Staggering right Gait velocity: Decreased Gait velocity interpretation: Below normal speed for age/gender General Gait Details: Patient significantly less steady on his feet than previous session. Had one LOB that required min A to maintain upright, although patient was attempting to maintain his balance. See vitals for BP response. Remains impulsive and either unaware or ignoring deficits.    Stairs            Wheelchair Mobility    Modified Rankin (Stroke Patients Only)       Balance Overall balance assessment: Needs assistance;History of Falls Sitting-balance support: Feet supported;Bilateral upper extremity supported Sitting balance-Leahy Scale: Fair     Standing balance support: Bilateral upper extremity supported Standing balance-Leahy Scale: Poor                      Cognition Arousal/Alertness: Awake/alert Behavior During Therapy: Impulsive Overall Cognitive Status: Within Functional Limits for tasks assessed           Safety/Judgement: Decreased awareness of safety;Decreased awareness of deficits     General Comments: Patient continues to be oriented. However, he is obviously frustrated by his condition and his discharge delays. He was extremely motivated to work with PT today.    Exercises      General Comments        Pertinent Vitals/Pain Pain Assessment: No/denies pain  Last BP taken on monitor at 9:00am BP in sitting = 114/61 BP in standing = 103/54 BP in sitting after ambulation = 134/67  Home Living                      Prior Function            PT Goals (current goals can now be found in the care plan section) Acute Rehab PT Goals Patient Stated Goal: Go home, take a walk outside PT Goal Formulation: With  patient Time For Goal Achievement: 01/07/15 Potential to Achieve Goals: Good Progress towards PT goals: Progressing toward goals    Frequency  Min 3X/week    PT Plan Frequency needs to be updated    Co-evaluation             End of Session Equipment Utilized During Treatment: Gait belt Activity Tolerance: Patient limited by fatigue Patient left: in bed;with call bell/phone within reach;with family/visitor present (with MD and CSW in room)     Time: 4373-5789 PT Time Calculation (min) (ACUTE ONLY): 18 min  Charges:  $Therapeutic Activity: 8-22 mins                    G CodesRoanna Epley, SPT [phone] 12/29/2014, 1:15 PM  I have read, reviewed and agree with student's note.   [place] [phone] (pager)

## 2014-12-29 NOTE — Care Management Note (Signed)
Case Management Note  Patient Details  Name: BEVERLY SURIANO MRN: [id number] Date of Birth: 1945-10-23  Subjective/Objective:     [facility]'s [facility] as [facility]  Transportation and appt with Dr Francia Greaves cancelled.  [facility] now [facility] equipment company that can deliver bed but they cannot fill pt's scripts for new meds because their pharmacy closes @ 3:30 p.m.  Social Worker, Elray Mcgregor will arrange appointment for pt on Tuesday morning ([facility] will be closed Monday) and will arrange transportation to clinic.  Scripts will be filled by [facility] after that visit.                           Expected Discharge Plan:  [place]  Discharge planning Services  CM Consult  Post Acute Care Choice:  Durable Medical Equipment  DME Arranged:  3-N-1, Walker rolling, Hospital bed DME Agency:  [facility], [place] Arranged:  RN, PT, OT, Speech Therapy, Nurse's Aide [facility] Agency:  [facility], [facility], [place], [place] 12/29/2014, 3:12 PM

## 2014-12-29 NOTE — Progress Notes (Signed)
Spoke with Dr. Thereasa Solo, who said to give 21 units of novolog this time, rather than 25 units.

## 2014-12-29 NOTE — Progress Notes (Signed)
Gave report to Remo Lipps, nurse on 6N.

## 2014-12-29 NOTE — Progress Notes (Signed)
[place] TEAM 1 - Stepdown/ICU TEAM PROGRESS NOTE  ORAL REMACHE ZOX:[id number] DOB: 12-29-1945 DOA: 12/21/2014 PCP: No primary care provider on file.  Admit HPI / Brief Narrative: 69 y.o. 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 During that stay he was intubated. Ultimately discharged from hospital 8/24 to a rehab facility. At the rehab facility he was noted to have progressive confusion and was brought back to the ED for further evaluation.  HPI/Subjective: The patient is resting comfortably in bed.  He is highly motivated to go home today.  He denies any complaints whatsoever but on further questioning does admit to some persisting sore throat.  He denies chest pain shortness breath fevers chills nausea or vomiting.  Assessment/Plan:  Sepsis - source unclear  - UA suggestive of UTI, urine culture with multiple species and recollection noted only yeast (usually not a pathogen)  - stopped abx tx 8/28 and following clinically - repeat UA 8/30 again noted multiple yeast - will dose w/ short course of diflucan given need for ongoing high dose immunosuppressant  - pt improving clinically - sepsis physiology resolved  Intermittent ICU delirium - Mental status waxes and wanes mildly but overall the patient is improving nicely  Diabetes type 2 -  severely uncontrolled  - CBG severely elevated in setting of ongoing high dose steroids therapy - presently it is not felt to be safe to taper his steroids therefore we will increase his insulin dosing again today  - the patient was not previously on insulin but will clearly need to go home on insulin as long as Decadron is continued   Diarrhea - resolved - pt is now having formed stools   Nausea and vomiting  - resolved - tolerating diet well  - XRAY with no signs of obstruction / illeus  Stage IV lung cancer with  metastasis to the brain  - Patient has just completed a course of whole brain XRT (?last tx 8/15) + high-dose steroid  - Continue patient's current Decadron dose 4 mg daily(family reports that prior attempts to wean to 53m were met w/ seizure activity)  - chemotherapy scheduled next week   Seizure disorder - Continue Keppra 1500 mg BID  Anemia of chronic disease, malignancy - no signs of active bleeding - Hgb stable - transfuse for Hgb < 7  Hyponatremia - ?SIADH related to lung CA - appears to vary between 125-130 - likely also a component of pseudohyponatremia due to severe hyperglycemia - Recheck in a.m.  Hypomagnesemia - Likely due to poor oral intake - continue to supplement as needed  Moderate pulmonary hypertension - normal EF per TTE 12/23/14 - weight is 71.1? kg which is down from 78.8 kg on admission - monitor daily weights and strict I/Os - no evidence of gross volume overload at this time  Malnutrition  Dysphagia Patient diagnosed with dysphagia at previous admission - SLP reevaluation at [facility] during this hospitalization and patient continues to require modified diet as ordered  Code Status: FULL Family Communication: Spoke with daughter at bedside at length  Disposition Plan: The plan was to discharge the patient home today with all arrangements made for the [facility] to [facility] and then for the patient to be transferred home - we were contacted  by the equipment provider arranged to the [facility] and informed that is necessary medical equipment would not be delivered to the house today - as a result the patient missed his window for discharge home - the earliest is arrangements can be accomplished in the interim his Tuesday - the patient will therefore be transferred to a medical bed for ongoing monitoring and titration of diabetes medications over the long  weekend with plan to attempt discharge again Tuesday morning  Consultants: None  Procedures: None  Antibiotics: Vancomycin 8/25 > 8/28 Levaquin 8/25 > 8/28  DVT prophylaxis: Lovenox  Objective: Blood pressure 132/62, pulse 88, temperature 98.6 F (37 C), temperature source Oral, resp. rate 18, height 6' (1.829 m), weight 71.1 kg (156 lb 12 oz), SpO2 96 %.  Intake/Output Summary (Last 24 hours) at 12/29/14 1741 Last data filed at 12/29/14 1300  Gross per 24 hour  Intake    960 ml  Output   1600 ml  Net   -640 ml   Exam: General: No acute respiratory distress Lungs: Clear to auscultation bilaterally without wheezes or crackles Cardiovascular: Regular rate and rhythm Abdomen: Nontender, nondistended, soft, bowel sounds positive, no rebound, no ascites, no appreciable mass Extremities: No significant cyanosis, clubbing, or edema bilateral lower extremities  Data Reviewed: Basic Metabolic Panel:  Recent Labs Lab 12/24/14 0555 12/25/14 0400 12/26/14 0219 12/27/14 0259 12/28/14 0223  NA 125* 125* 123* 129* 121*  K 4.1 4.4 4.5 5.3* 4.6  CL 89* 88* 85* 90* 83*  CO2 _0 GLUCOSE 227* 317* 439* 312* 284*  BUN 14 17 24* 22* 17  CREATININE 0.53* 0.67 0.72 0.67 0.59*  CALCIUM 8.0* 8.0* 7.9* 8.7* 8.5*  MG 1.3* 1.5*  --  1.6* 1.7    CBC:  Recent Labs Lab 12/24/14 0555 12/25/14 0400 12/26/14 0219 12/27/14 0259  WBC 17.1* 13.9* 15.3* 19.7*  NEUTROABS  --   --   --  18.1*  HGB 7.8* 7.7* 8.4* 9.1*  HCT 23.9* 23.6* 26.3* 28.7*  MCV 75.6* 74.9* 74.9* 75.1*  PLT 237 291 333 349    Liver Function Tests:  Recent Labs Lab 12/26/14 0219 12/27/14 0259  AST 21 25  ALT 37 38  ALKPHOS 156* 161*  BILITOT 0.5 0.6  PROT 5.5* 6.1*  ALBUMIN 1.4* 1.5*   CBG:  Recent Labs Lab 12/28/14 1752 12/28/14 2210 12/29/14 0815 12/29/14 1220 12/29/14 1640  GLUCAP 358* 336* 311* 268* 253*    Recent Results (from the past 240 hour(s))  Culture, blood (routine x  2)     Status: None   Collection Time: 12/21/14  7:21 PM  Result Value Ref Range Status   Specimen Description BLOOD LEFT ARM  Final   Special Requests BOTTLES DRAWN AEROBIC AND ANAEROBIC 5CC  Final   Culture NO GROWTH 5 DAYS  Final   Report Status 12/26/2014 FINAL  Final  Culture, blood (routine x 2)     Status: None   Collection Time: 12/21/14  7:24 PM  Result Value Ref Range Status   Specimen Description BLOOD RIGHT ARM  Final   Special Requests BOTTLES DRAWN AEROBIC AND ANAEROBIC 10CC  Final   Culture NO GROWTH 5 DAYS  Final   Report Status 12/26/2014 FINAL  Final  Urine culture     Status: None   Collection Time: 12/21/14 10:53 PM  Result Value Ref Range Status   Specimen Description URINE, CATHETERIZED  Final   Special Requests NONE  Final  Culture MULTIPLE SPECIES PRESENT, SUGGEST RECOLLECTION  Final   Report Status 12/22/2014 FINAL  Final  Culture, expectorated sputum-assessment     Status: None   Collection Time: 12/22/14  1:52 PM  Result Value Ref Range Status   Specimen Description EXPECTORATED SPUTUM  Final   Special Requests Immunocompromised  Final   Sputum evaluation   Final    THIS SPECIMEN IS ACCEPTABLE. RESPIRATORY CULTURE REPORT TO FOLLOW.   Report Status 12/22/2014 FINAL  Final  Culture, respiratory (NON-Expectorated)     Status: None   Collection Time: 12/22/14  1:52 PM  Result Value Ref Range Status   Specimen Description SPUTUM  Final   Special Requests NONE  Final   Gram Stain   Final    FEW WBC PRESENT, PREDOMINANTLY MONONUCLEAR RARE SQUAMOUS EPITHELIAL CELLS PRESENT FEW GRAM POSITIVE COCCI IN PAIRS IN CLUSTERS RARE GRAM POSITIVE RODS RARE GRAM NEGATIVE RODS    Culture   Final    NORMAL OROPHARYNGEAL FLORA Performed at [facility]    Report Status 12/25/2014 FINAL  Final  Urine culture     Status: None   Collection Time: 12/23/14  9:55 PM  Result Value Ref Range Status   Specimen Description URINE, CLEAN CATCH  Final   Special  Requests NONE  Final   Culture >=100,000 COLONIES/mL YEAST  Final   Report Status 12/25/2014 FINAL  Final  Culture, Urine     Status: None   Collection Time: 12/27/14  4:59 PM  Result Value Ref Range Status   Specimen Description URINE, CLEAN CATCH  Final   Special Requests NONE  Final   Culture >=100,000 COLONIES/mL YEAST  Final   Report Status 12/29/2014 FINAL  Final     Studies:   Recent x-ray studies have been reviewed in detail by the Attending Physician  Scheduled Meds:  Scheduled Meds: . atorvastatin  40 mg Oral q1800  . clopidogrel  75 mg Oral Daily  . dexamethasone  4 mg Oral QID  . enoxaparin (LOVENOX) injection  40 mg Subcutaneous Q24H  . feeding supplement (GLUCERNA SHAKE)  237 mL Oral TID BM  . fluconazole  100 mg Oral Daily  . gabapentin  300 mg Oral BID  . insulin aspart  0-20 Units Subcutaneous TID WC  . insulin aspart  10 Units Subcutaneous TID WC  . insulin glargine  40 Units Subcutaneous BID  . levETIRAcetam  1,500 mg Oral BID  . polyvinyl alcohol  1 drop Both Eyes QID    Time spent on care of this patient: 35 mins   Bernardette Waldron T , MD   [facility] Office  [phone] Pager - Text Page per Shea Evans as per below:  On-Call/Text Page:      [email or web address]      password [email or web address]  If 7PM-7AM, please contact night-coverage [email or web address] Password [email or web address] 12/29/2014, 5:41 PM   LOS: 8 days

## 2014-12-29 NOTE — Care Management Note (Signed)
Case Management Note  Patient Details  Name: Frank Krueger MRN: [id number] Date of Birth: Apr 09, 1946  Subjective/Objective:   Frank Krueger has arranged appointment for Tuesday, 9/6, @ 11:30 am, pt will see MD and pick up medications, then be transported home.  Hospital bed is being delivered to pt's home tonight.  CM will call Travel Dept @ [phone] x [phone] with new room assignment.                 Frank Krueger, Frank Groom, RN 12/29/2014, 3:44 PM

## 2014-12-30 DIAGNOSIS — A419 Sepsis, unspecified organism: Principal | ICD-10-CM

## 2014-12-30 DIAGNOSIS — C3490 Malignant neoplasm of unspecified part of unspecified bronchus or lung: Secondary | ICD-10-CM

## 2014-12-30 DIAGNOSIS — E1165 Type 2 diabetes mellitus with hyperglycemia: Secondary | ICD-10-CM

## 2014-12-30 DIAGNOSIS — R5381 Other malaise: Secondary | ICD-10-CM

## 2014-12-30 DIAGNOSIS — G40909 Epilepsy, unspecified, not intractable, without status epilepticus: Secondary | ICD-10-CM

## 2014-12-30 DIAGNOSIS — E871 Hypo-osmolality and hyponatremia: Secondary | ICD-10-CM

## 2014-12-30 DIAGNOSIS — C7931 Secondary malignant neoplasm of brain: Secondary | ICD-10-CM

## 2014-12-30 LAB — GLUCOSE, CAPILLARY
GLUCOSE-CAPILLARY: 217 mg/dL — AB (ref 65–99)
GLUCOSE-CAPILLARY: 191 mg/dL — AB (ref 65–99)
GLUCOSE-CAPILLARY: 300 mg/dL — AB (ref 65–99)
GLUCOSE-CAPILLARY: 312 mg/dL — AB (ref 65–99)
Glucose-Capillary: 302 mg/dL — ABNORMAL HIGH (ref 65–99)
Glucose-Capillary: 308 mg/dL — ABNORMAL HIGH (ref 65–99)

## 2014-12-30 MED ORDER — FAMOTIDINE 20 MG PO TABS
20.0000 mg | ORAL_TABLET | Freq: Two times a day (BID) | ORAL | Status: DC
  Administered 2014-12-30 – 2015-01-02 (×7): 20 mg via ORAL
  Filled 2014-12-30 (×7): qty 1

## 2014-12-30 MED ORDER — INSULIN ASPART 100 UNIT/ML ~~LOC~~ SOLN
0.0000 [IU] | Freq: Every day | SUBCUTANEOUS | Status: DC
  Administered 2014-12-30 – 2014-12-31 (×2): 4 [IU] via SUBCUTANEOUS
  Administered 2015-01-01: 2 [IU] via SUBCUTANEOUS

## 2014-12-30 MED ORDER — LORATADINE 10 MG PO TABS
10.0000 mg | ORAL_TABLET | Freq: Every day | ORAL | Status: DC
  Administered 2014-12-30 – 2015-01-02 (×4): 10 mg via ORAL
  Filled 2014-12-30 (×4): qty 1

## 2014-12-30 MED ORDER — FLUTICASONE PROPIONATE 50 MCG/ACT NA SUSP
2.0000 | Freq: Every day | NASAL | Status: DC
  Administered 2014-12-30 – 2015-01-02 (×4): 2 via NASAL
  Filled 2014-12-30: qty 16

## 2014-12-30 MED ORDER — INSULIN ASPART 100 UNIT/ML ~~LOC~~ SOLN
0.0000 [IU] | Freq: Three times a day (TID) | SUBCUTANEOUS | Status: DC
  Administered 2014-12-31: 4 [IU] via SUBCUTANEOUS
  Administered 2014-12-31: 7 [IU] via SUBCUTANEOUS
  Administered 2014-12-31: 3 [IU] via SUBCUTANEOUS
  Administered 2015-01-01: 7 [IU] via SUBCUTANEOUS
  Administered 2015-01-01: 3 [IU] via SUBCUTANEOUS
  Administered 2015-01-01: 11 [IU] via SUBCUTANEOUS
  Administered 2015-01-02: 3 [IU] via SUBCUTANEOUS

## 2014-12-30 NOTE — Progress Notes (Signed)
[place] TEAM 1 - Stepdown/ICU TEAM PROGRESS NOTE  Frank Krueger ZMC:[id number] DOB: 05-19-1945 DOA: 12/21/2014 PCP: No primary care provider on file.  Admit HPI / Brief Narrative: 69 y.o. 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 During that stay he was intubated. Ultimately discharged from hospital 8/24 to a rehab facility. At the rehab facility he was noted to have progressive confusion and was brought back to the ED for further evaluation.  HPI/Subjective: Feeling well , in good spirit today , reported mild intermittent headache, no n/v, have bm, daughter in room  Assessment/Plan:  Sepsis - source unclear  - UA suggestive of UTI, urine culture with multiple species and recollection noted only yeast (usually not a pathogen)  - stopped abx tx 8/28 and following clinically - repeat UA 8/30 again noted multiple yeast - will dose w/ short course of diflucan given need for ongoing high dose immunosuppressant  - pt improving clinically - sepsis physiology resolved  Intermittent ICU delirium - Mental status waxes and wanes mildly but overall the patient is improving nicely  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  - Patient has just completed a course of whole brain XRT (?last tx 8/15) + high-dose steroid  - Continue  patient's current Decadron dose 4 mg daily(family reports that prior attempts to wean to $Remov'2mg'xaFahz$  were met w/ seizure activity)  - chemotherapy scheduled next week   Seizure disorder - Continue Keppra 1500 mg BID  Anemia of chronic disease, malignancy - no signs of active bleeding - Hgb stable - transfuse for Hgb < 7  Hyponatremia - ?SIADH related to lung CA - appears to vary between 125-130 - likely also a component of pseudohyponatremia due to severe hyperglycemia - fluids restriction  Hypomagnesemia - Likely due to poor oral intake - continue to supplement as needed  Moderate pulmonary hypertension - normal EF per TTE 12/23/14 - weight is 71.1? kg which is down from 78.8 kg on admission - monitor daily weights and strict I/Os - no evidence of gross volume overload at this time  Malnutrition  Dysphagia Patient diagnosed with dysphagia at previous admission - SLP reevaluation at [facility] during this hospitalization and patient continues to require modified diet as ordered  Code Status: FULL Family Communication: Spoke with daughter at bedside at length  Disposition Plan: The plan was to discharge the patient home today with all arrangements made for the patient to be transported from the hospital to the [place] [facility] to be evaluated and have his prescriptions written by [facility] doctor and then for the patient to be transferred home - we were contacted by the equipment provider arranged to the [facility] and informed that is necessary medical equipment would not be delivered to the house today -  as a result the patient missed his window for discharge home - the earliest is arrangements can be accomplished in the interim his Tuesday - the patient will therefore be transferred to a medical bed for ongoing monitoring and titration of diabetes medications over the long weekend with plan to attempt discharge again Tuesday morning  Consultants: None  Procedures: None  Antibiotics: Vancomycin  8/25 > 8/28 Levaquin 8/25 > 8/28  DVT prophylaxis: Lovenox  Objective: Blood pressure 117/68, pulse 69, temperature 98.4 F (36.9 C), temperature source Oral, resp. rate 20, height 6' (1.829 m), weight 156 lb 12 oz (71.1 kg), SpO2 98 %.  Intake/Output Summary (Last 24 hours) at 12/30/14 1016 Last data filed at 12/30/14 0944  Gross per 24 hour  Intake    957 ml  Output   3300 ml  Net  -2343 ml   Exam: General: No acute respiratory distress Lungs: Clear to auscultation bilaterally without wheezes or crackles Cardiovascular: Regular rate and rhythm Abdomen: Nontender, nondistended, soft, bowel sounds positive, no rebound, no ascites, no appreciable mass Extremities: No significant cyanosis, clubbing, or edema bilateral lower extremities Skin: duoderm to sarcum Neuro: aaox3, hard of hearing at baseline  Data Reviewed: Basic Metabolic Panel:  Recent Labs Lab 12/24/14 0555 12/25/14 0400 12/26/14 0219 12/27/14 0259 12/28/14 0223  NA 125* 125* 123* 129* 121*  K 4.1 4.4 4.5 5.3* 4.6  CL 89* 88* 85* 90* 83*  CO2 $Re'28 29 28 29 26  'Scu$ GLUCOSE 227* 317* 439* 312* 284*  BUN 14 17 24* 22* 17  CREATININE 0.53* 0.67 0.72 0.67 0.59*  CALCIUM 8.0* 8.0* 7.9* 8.7* 8.5*  MG 1.3* 1.5*  --  1.6* 1.7    CBC:  Recent Labs Lab 12/24/14 0555 12/25/14 0400 12/26/14 0219 12/27/14 0259  WBC 17.1* 13.9* 15.3* 19.7*  NEUTROABS  --   --   --  18.1*  HGB 7.8* 7.7* 8.4* 9.1*  HCT 23.9* 23.6* 26.3* 28.7*  MCV 75.6* 74.9* 74.9* 75.1*  PLT 237 291 333 349    Liver Function Tests:  Recent Labs Lab 12/26/14 0219 12/27/14 0259  AST 21 25  ALT 37 38  ALKPHOS 156* 161*  BILITOT 0.5 0.6  PROT 5.5* 6.1*  ALBUMIN 1.4* 1.5*   CBG:  Recent Labs Lab 12/29/14 1220 12/29/14 1640 12/29/14 2135 12/30/14 0606 12/30/14 0751  GLUCAP 268* 253* 302* 217* 191*    Recent Results (from the past 240 hour(s))  Culture, blood (routine x 2)     Status: None   Collection Time: 12/21/14  7:21 PM    Result Value Ref Range Status   Specimen Description BLOOD LEFT ARM  Final   Special Requests BOTTLES DRAWN AEROBIC AND ANAEROBIC 5CC  Final   Culture NO GROWTH 5 DAYS  Final   Report Status 12/26/2014 FINAL  Final  Culture, blood (routine x 2)     Status: None   Collection Time: 12/21/14  7:24 PM  Result Value Ref Range Status   Specimen Description BLOOD RIGHT ARM  Final   Special Requests BOTTLES DRAWN AEROBIC AND ANAEROBIC 10CC  Final   Culture NO GROWTH 5 DAYS  Final   Report Status 12/26/2014 FINAL  Final  Urine culture     Status: None   Collection Time: 12/21/14 10:53 PM  Result Value Ref Range Status   Specimen Description URINE, CATHETERIZED  Final   Special Requests NONE  Final   Culture MULTIPLE SPECIES PRESENT, SUGGEST RECOLLECTION  Final   Report Status 12/22/2014  FINAL  Final  Culture, expectorated sputum-assessment     Status: None   Collection Time: 12/22/14  1:52 PM  Result Value Ref Range Status   Specimen Description EXPECTORATED SPUTUM  Final   Special Requests Immunocompromised  Final   Sputum evaluation   Final    THIS SPECIMEN IS ACCEPTABLE. RESPIRATORY CULTURE REPORT TO FOLLOW.   Report Status 12/22/2014 FINAL  Final  Culture, respiratory (NON-Expectorated)     Status: None   Collection Time: 12/22/14  1:52 PM  Result Value Ref Range Status   Specimen Description SPUTUM  Final   Special Requests NONE  Final   Gram Stain   Final    FEW WBC PRESENT, PREDOMINANTLY MONONUCLEAR RARE SQUAMOUS EPITHELIAL CELLS PRESENT FEW GRAM POSITIVE COCCI IN PAIRS IN CLUSTERS RARE GRAM POSITIVE RODS RARE GRAM NEGATIVE RODS    Culture   Final    NORMAL OROPHARYNGEAL FLORA Performed at [facility]    Report Status 12/25/2014 FINAL  Final  Urine culture     Status: None   Collection Time: 12/23/14  9:55 PM  Result Value Ref Range Status   Specimen Description URINE, CLEAN CATCH  Final   Special Requests NONE  Final   Culture >=100,000 COLONIES/mL YEAST   Final   Report Status 12/25/2014 FINAL  Final  Culture, Urine     Status: None   Collection Time: 12/27/14  4:59 PM  Result Value Ref Range Status   Specimen Description URINE, CLEAN CATCH  Final   Special Requests NONE  Final   Culture >=100,000 COLONIES/mL YEAST  Final   Report Status 12/29/2014 FINAL  Final     Studies:   Recent x-ray studies have been reviewed in detail by the Attending Physician  Scheduled Meds:  Scheduled Meds: . atorvastatin  40 mg Oral q1800  . clopidogrel  75 mg Oral Daily  . dexamethasone  4 mg Oral QID  . enoxaparin (LOVENOX) injection  40 mg Subcutaneous Q24H  . feeding supplement (GLUCERNA SHAKE)  237 mL Oral TID BM  . fluconazole  100 mg Oral Daily  . gabapentin  300 mg Oral BID  . insulin aspart  0-20 Units Subcutaneous TID WC  . insulin aspart  10 Units Subcutaneous TID WC  . insulin glargine  40 Units Subcutaneous BID  . levETIRAcetam  1,500 mg Oral BID  . polyvinyl alcohol  1 drop Both Eyes QID    Time spent on care of this patient: 53 mins   Helix Lafontaine , MD PhD  [facility] Office  [phone] Pager - Text Page per Shea Evans as per below:  On-Call/Text Page:      [email or web address]      password [email or web address]  If 7PM-7AM, please contact night-coverage [email or web address] Password [email or web address] 12/30/2014, 10:16 AM   LOS: 9 days

## 2014-12-31 LAB — CBC
HCT: 25.6 % — ABNORMAL LOW (ref 39.0–52.0)
Hemoglobin: 8.3 g/dL — ABNORMAL LOW (ref 13.0–17.0)
MCH: 23.9 pg — AB (ref 26.0–34.0)
MCHC: 32.4 g/dL (ref 30.0–36.0)
MCV: 73.6 fL — AB (ref 78.0–100.0)
PLATELETS: 463 10*3/uL — AB (ref 150–400)
RBC: 3.48 MIL/uL — ABNORMAL LOW (ref 4.22–5.81)
RDW: 18.9 % — AB (ref 11.5–15.5)
WBC: 26.7 10*3/uL — ABNORMAL HIGH (ref 4.0–10.5)

## 2014-12-31 LAB — BASIC METABOLIC PANEL
Anion gap: 10 (ref 5–15)
BUN: 17 mg/dL (ref 6–20)
CO2: 26 mmol/L (ref 22–32)
CREATININE: 0.54 mg/dL — AB (ref 0.61–1.24)
Calcium: 8.7 mg/dL — ABNORMAL LOW (ref 8.9–10.3)
Chloride: 87 mmol/L — ABNORMAL LOW (ref 101–111)
GFR calc Af Amer: >60 mL/min (ref >60)
GLUCOSE: 163 mg/dL — AB (ref 65–99)
Potassium: 4.5 mmol/L (ref 3.5–5.1)
SODIUM: 123 mmol/L — AB (ref 135–145)

## 2014-12-31 LAB — URINALYSIS, ROUTINE W REFLEX MICROSCOPIC
Bilirubin Urine: NEGATIVE
Glucose, UA: 100 mg/dL — AB
HGB URINE DIPSTICK: NEGATIVE
KETONES UR: NEGATIVE mg/dL
Leukocytes, UA: NEGATIVE
Nitrite: NEGATIVE
PROTEIN: NEGATIVE mg/dL
Specific Gravity, Urine: 1.018 (ref 1.005–1.030)
UROBILINOGEN UA: 1 mg/dL (ref 0.0–1.0)
pH: 7 (ref 5.0–8.0)

## 2014-12-31 LAB — GLUCOSE, CAPILLARY
GLUCOSE-CAPILLARY: 219 mg/dL — AB (ref 65–99)
GLUCOSE-CAPILLARY: 198 mg/dL — AB (ref 65–99)
Glucose-Capillary: 149 mg/dL — ABNORMAL HIGH (ref 65–99)
Glucose-Capillary: 318 mg/dL — ABNORMAL HIGH (ref 65–99)

## 2014-12-31 LAB — TSH: TSH: 0.474 u[IU]/mL (ref 0.350–4.500)

## 2014-12-31 LAB — MAGNESIUM: MAGNESIUM: 1.4 mg/dL — AB (ref 1.7–2.4)

## 2014-12-31 MED ORDER — MAGNESIUM SULFATE 2 GM/50ML IV SOLN
2.0000 g | Freq: Once | INTRAVENOUS | Status: AC
  Administered 2014-12-31: 2 g via INTRAVENOUS
  Filled 2014-12-31: qty 50

## 2014-12-31 MED ORDER — WHITE PETROLATUM GEL
Status: AC
  Filled 2014-12-31: qty 1

## 2014-12-31 MED ORDER — SODIUM CHLORIDE 1 G PO TABS
1.0000 g | ORAL_TABLET | Freq: Two times a day (BID) | ORAL | Status: DC
  Administered 2015-01-01: 1 g via ORAL
  Filled 2014-12-31 (×8): qty 1

## 2014-12-31 NOTE — Progress Notes (Signed)
[facility] TEAM 1 - Stepdown/ICU TEAM PROGRESS NOTE  Frank Krueger DTO:[id number] DOB: 1945-06-30 DOA: 12/21/2014 PCP: No primary care provider on file.  Admit HPI / Brief Narrative: 69 y.o. 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 During that stay he was intubated. Ultimately discharged from hospital 8/24 to a rehab facility. At the rehab facility he was noted to have progressive confusion and was brought back to the ED on 8/25 for further evaluation.  HPI/Subjective: Drowsy this am, reported feeling weak, and dizzy upon standing up, denies pain, no fever, daughter in room  Assessment/Plan:  Sepsis - source unclear  - UA suggestive of UTI, urine culture with multiple species and recollection noted only yeast (usually not a pathogen)  - stopped abx tx 8/28 and following clinically - repeat UA 8/30 again noted multiple yeast - will dose w/ short course of diflucan given need for ongoing high dose immunosuppressant  - pt improving clinically - sepsis physiology resolved -repeat ua due to drowsy and fatigue on 9/4  Intermittent delirium - Mental status waxes and wanes mildly but overall the patient is improving nicely -drowsy, not oriented to time on 9/4, delirium?  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  - Patient has  just completed a course of whole brain XRT (?last tx 8/15) + high-dose steroid  - Continue patient's current Decadron dose 4 mg daily(family reports that prior attempts to wean to 43m were met w/ seizure activity)  - chemotherapy scheduled next week   Seizure disorder - Continue Keppra 1500 mg BID  Anemia of chronic disease, malignancy - no signs of active bleeding - Hgb stable - transfuse for Hgb < 7  Hyponatremia - ?SIADH related to lung CA - appears to vary between 125-130 - likely also a component of pseudohyponatremia due to severe hyperglycemia - fluids restriction, salt tabs  Hypomagnesemia - Likely due to poor oral intake - continue to supplement as needed  Moderate pulmonary hypertension - normal EF per TTE 12/23/14 - weight is 71.1? kg which is down from 78.8 kg on admission - monitor daily weights and strict I/Os - no evidence of gross volume overload at this time  Malnutrition, severe in the setting of chronic illness: nutrition supplement  Dysphagia Patient diagnosed with dysphagia at previous admission - SLP reevaluation at [facility] during this hospitalization and patient continues to require modified diet as ordered  Code Status: FULL Family Communication: Spoke with daughter at bedside at length  Disposition Plan: The plan was to discharge the patient home on 9/1 with all arrangements made for the patient to be [facility] and then for the patient to be  transferred home - we were contacted by the equipment provider arranged to the [facility] and informed that is necessary medical equipment would not be delivered to the house today - as a result the patient missed his window for discharge home - the earliest is arrangements can be accomplished in the interim is Tuesday - the patient will therefore be transferred to a medical bed for ongoing monitoring and titration of  diabetes medications over the long weekend with plan to attempt discharge again Tuesday morning  Consultants: None  Procedures: None  Antibiotics: Vancomycin 8/25 > 8/28 Levaquin 8/25 > 8/28  DVT prophylaxis: Lovenox  Objective: Blood pressure 134/87, pulse 76, temperature 97.9 F (36.6 C), temperature source Oral, resp. rate 20, height 6' (1.829 m), weight 156 lb 12 oz (71.1 kg), SpO2 96 %.  Intake/Output Summary (Last 24 hours) at 12/31/14 1258 Last data filed at 12/31/14 0045  Gross per 24 hour  Intake    840 ml  Output    800 ml  Net     40 ml   Exam: General: No acute respiratory distress Lungs: Clear to auscultation bilaterally without wheezes or crackles Cardiovascular: Regular rate and rhythm Abdomen: Nontender, nondistended, soft, bowel sounds positive, no rebound, no ascites, no appreciable mass Extremities: No significant cyanosis, clubbing, or edema bilateral lower extremities Skin: duoderm to sarcum Neuro: intermittent confusion, hard of hearing at baseline  Data Reviewed: Basic Metabolic Panel:  Recent Labs Lab 12/25/14 0400 12/26/14 0219 12/27/14 0259 12/28/14 0223 12/31/14 0709  NA 125* 123* 129* 121* 123*  K 4.4 4.5 5.3* 4.6 4.5  CL 88* 85* 90* 83* 87*  CO2 _0 GLUCOSE 317* 439* 312* 284* 163*  BUN 17 24* 22* 17 17  CREATININE 0.67 0.72 0.67 0.59* 0.54*  CALCIUM 8.0* 7.9* 8.7* 8.5* 8.7*  MG 1.5*  --  1.6* 1.7 1.4*    CBC:  Recent Labs Lab 12/25/14 0400 12/26/14 0219 12/27/14 0259 12/31/14 0709  WBC 13.9* 15.3* 19.7* 26.7*  NEUTROABS  --   --  18.1*  --   HGB 7.7* 8.4* 9.1* 8.3*  HCT 23.6* 26.3* 28.7* 25.6*  MCV 74.9* 74.9* 75.1* 73.6*  PLT 291 333 349 463*    Liver Function Tests:  Recent Labs Lab 12/26/14 0219 12/27/14 0259  AST 21 25  ALT 37 38  ALKPHOS 156* 161*  BILITOT 0.5 0.6  PROT 5.5* 6.1*  ALBUMIN 1.4* 1.5*   CBG:  Recent Labs Lab 12/30/14 1123 12/30/14 1712 12/30/14 2106 12/31/14 0744  12/31/14 1225  GLUCAP 308* 300* 312* 149* 219*    Recent Results (from the past 240 hour(s))  Culture, blood (routine x 2)     Status: None   Collection Time: 12/21/14  7:21 PM  Result Value Ref Range Status   Specimen Description BLOOD LEFT ARM  Final   Special Requests BOTTLES DRAWN AEROBIC AND ANAEROBIC 5CC  Final   Culture NO GROWTH 5 DAYS  Final   Report Status 12/26/2014 FINAL  Final  Culture, blood (routine x 2)     Status: None   Collection Time: 12/21/14  7:24 PM  Result Value Ref Range Status   Specimen Description BLOOD RIGHT ARM  Final   Special Requests BOTTLES DRAWN AEROBIC AND ANAEROBIC 10CC  Final   Culture NO GROWTH 5 DAYS  Final   Report Status 12/26/2014 FINAL  Final  Urine culture     Status: None   Collection Time: 12/21/14 10:53 PM  Result  Value Ref Range Status   Specimen Description URINE, CATHETERIZED  Final   Special Requests NONE  Final   Culture MULTIPLE SPECIES PRESENT, SUGGEST RECOLLECTION  Final   Report Status 12/22/2014 FINAL  Final  Culture, expectorated sputum-assessment     Status: None   Collection Time: 12/22/14  1:52 PM  Result Value Ref Range Status   Specimen Description EXPECTORATED SPUTUM  Final   Special Requests Immunocompromised  Final   Sputum evaluation   Final    THIS SPECIMEN IS ACCEPTABLE. RESPIRATORY CULTURE REPORT TO FOLLOW.   Report Status 12/22/2014 FINAL  Final  Culture, respiratory (NON-Expectorated)     Status: None   Collection Time: 12/22/14  1:52 PM  Result Value Ref Range Status   Specimen Description SPUTUM  Final   Special Requests NONE  Final   Gram Stain   Final    FEW WBC PRESENT, PREDOMINANTLY MONONUCLEAR RARE SQUAMOUS EPITHELIAL CELLS PRESENT FEW GRAM POSITIVE COCCI IN PAIRS IN CLUSTERS RARE GRAM POSITIVE RODS RARE GRAM NEGATIVE RODS    Culture   Final    NORMAL OROPHARYNGEAL FLORA Performed at [facility]    Report Status 12/25/2014 FINAL  Final  Urine culture     Status: None    Collection Time: 12/23/14  9:55 PM  Result Value Ref Range Status   Specimen Description URINE, CLEAN CATCH  Final   Special Requests NONE  Final   Culture >=100,000 COLONIES/mL YEAST  Final   Report Status 12/25/2014 FINAL  Final  Culture, Urine     Status: None   Collection Time: 12/27/14  4:59 PM  Result Value Ref Range Status   Specimen Description URINE, CLEAN CATCH  Final   Special Requests NONE  Final   Culture >=100,000 COLONIES/mL YEAST  Final   Report Status 12/29/2014 FINAL  Final     Studies:   Recent x-ray studies have been reviewed in detail by the Attending Physician  Scheduled Meds:  Scheduled Meds: . atorvastatin  40 mg Oral q1800  . clopidogrel  75 mg Oral Daily  . dexamethasone  4 mg Oral QID  . enoxaparin (LOVENOX) injection  40 mg Subcutaneous Q24H  . famotidine  20 mg Oral BID  . feeding supplement (GLUCERNA SHAKE)  237 mL Oral TID BM  . fluticasone  2 spray Each Nare Daily  . gabapentin  300 mg Oral BID  . insulin aspart  0-20 Units Subcutaneous TID WC  . insulin aspart  0-5 Units Subcutaneous QHS  . insulin aspart  10 Units Subcutaneous TID WC  . insulin glargine  40 Units Subcutaneous BID  . levETIRAcetam  1,500 mg Oral BID  . loratadine  10 mg Oral Daily  . polyvinyl alcohol  1 drop Both Eyes QID    Time spent on care of this patient: 5 mins   Aanshi Batchelder , MD PhD  [facility] Office  [phone] Pager - Text Page per Amion as per below:  On-Call/Text Page:      [email or web address]      password [email or web address]  If 7PM-7AM, please contact night-coverage [email or web address] Password [email or web address] 12/31/2014, 12:58 PM   LOS: 10 days

## 2015-01-01 LAB — CBC WITH DIFFERENTIAL/PLATELET
BASOS ABS: 0 10*3/uL (ref 0.0–0.1)
Basophils Relative: 0 % (ref 0–1)
EOS ABS: 0 10*3/uL (ref 0.0–0.7)
Eosinophils Relative: 0 % (ref 0–5)
HCT: 26.9 % — ABNORMAL LOW (ref 39.0–52.0)
Hemoglobin: 8.7 g/dL — ABNORMAL LOW (ref 13.0–17.0)
Lymphocytes Relative: 4 % — ABNORMAL LOW (ref 12–46)
Lymphs Abs: 1.1 10*3/uL (ref 0.7–4.0)
MCH: 23.9 pg — AB (ref 26.0–34.0)
MCHC: 32.3 g/dL (ref 30.0–36.0)
MCV: 73.9 fL — ABNORMAL LOW (ref 78.0–100.0)
Monocytes Absolute: 1.4 10*3/uL — ABNORMAL HIGH (ref 0.1–1.0)
Monocytes Relative: 5 % (ref 3–12)
Neutro Abs: 24.6 10*3/uL — ABNORMAL HIGH (ref 1.7–7.7)
Neutrophils Relative %: 91 % — ABNORMAL HIGH (ref 43–77)
PLATELETS: 473 10*3/uL — AB (ref 150–400)
RBC: 3.64 MIL/uL — ABNORMAL LOW (ref 4.22–5.81)
RDW: 19 % — ABNORMAL HIGH (ref 11.5–15.5)
WBC: 27.1 10*3/uL — AB (ref 4.0–10.5)

## 2015-01-01 LAB — COMPREHENSIVE METABOLIC PANEL
ALBUMIN: 1.2 g/dL — AB (ref 3.5–5.0)
ALT: 54 U/L (ref 17–63)
ANION GAP: 10 (ref 5–15)
AST: 42 U/L — AB (ref 15–41)
Alkaline Phosphatase: 207 U/L — ABNORMAL HIGH (ref 38–126)
BUN: 20 mg/dL (ref 6–20)
CHLORIDE: 88 mmol/L — AB (ref 101–111)
CO2: 27 mmol/L (ref 22–32)
Calcium: 8.4 mg/dL — ABNORMAL LOW (ref 8.9–10.3)
Creatinine, Ser: 0.63 mg/dL (ref 0.61–1.24)
GFR calc Af Amer: >60 mL/min (ref >60)
Glucose, Bld: 219 mg/dL — ABNORMAL HIGH (ref 65–99)
POTASSIUM: 4.8 mmol/L (ref 3.5–5.1)
Sodium: 125 mmol/L — ABNORMAL LOW (ref 135–145)
Total Bilirubin: 0.4 mg/dL (ref 0.3–1.2)
Total Protein: 5.8 g/dL — ABNORMAL LOW (ref 6.5–8.1)

## 2015-01-01 LAB — GLUCOSE, CAPILLARY
GLUCOSE-CAPILLARY: 225 mg/dL — AB (ref 65–99)
GLUCOSE-CAPILLARY: 271 mg/dL — AB (ref 65–99)
Glucose-Capillary: 149 mg/dL — ABNORMAL HIGH (ref 65–99)
Glucose-Capillary: 240 mg/dL — ABNORMAL HIGH (ref 65–99)

## 2015-01-01 LAB — LACTIC ACID, PLASMA: LACTIC ACID, VENOUS: 1.4 mmol/L (ref 0.5–2.0)

## 2015-01-01 LAB — MAGNESIUM: MAGNESIUM: 1.6 mg/dL — AB (ref 1.7–2.4)

## 2015-01-01 MED ORDER — MAGNESIUM 30 MG PO TABS: 30.0000 mg | ORAL_TABLET | Freq: Two times a day (BID) | ORAL | Status: DC

## 2015-01-01 MED ORDER — MAGNESIUM SULFATE 2 GM/50ML IV SOLN
2.0000 g | Freq: Once | INTRAVENOUS | Status: AC
  Administered 2015-01-01: 2 g via INTRAVENOUS
  Filled 2015-01-01: qty 50

## 2015-01-01 MED ORDER — SODIUM CHLORIDE 1 G PO TABS: 1.0000 g | ORAL_TABLET | Freq: Two times a day (BID) | ORAL | Status: DC

## 2015-01-01 NOTE — Addendum Note (Signed)
Encounter addended by: Kyung Rudd, MD on: 01/01/2015  9:35 PM<BR>     Documentation filed: Notes Section, Visit Diagnoses

## 2015-01-01 NOTE — Progress Notes (Signed)
Speech Language Pathology Treatment: Dysphagia  Patient Details Name: Frank Krueger MRN: [id number] DOB: August 05, 1945 Today's Date: 01/01/2015 Time: 5747-3403 SLP Time Calculation (min) (ACUTE ONLY): 38 min  Assessment / Plan / Recommendation Clinical Impression  Pt observed consuming lunch = no symptoms of overt aspiration noted with broccoli, potatoes, chicken, pudding, and thickened Sprite and buttermilk.  Pt eats at slow rate and demonstrates no symptoms of severe residuals.  Wet voice noted x2 during entire session - SLP cue to throat clear effective.  Advised pt/family to suspected laryngeal penetration of secretions and/or intake with wet voice and advised to clear.    Pt continues with hoarseness but improved compared to prior to intubation.  SLP listened to pt's voice on voice mail daughter saved and voice is not near baseline.  Respiratory status has improved significantly per daughter.    As pt has demonstrated medical progress, has a difficult airway and he desires advancement in diet, SLP recommends to repeat MBS next am to assure pt leaves on least restrictive diet.  Pt and daughter agreeable, Will plan early in the am due to pt pending dc next am per daughter.    Recommend continue current diet with strict precautions and assuring adequacy of liquid intake.  Educated daughter/pt to other risk factors for aspiration pneumonias besides dysphagia and provided written information re: heimlich maneuver and [employer] protocol in case it is recommended for pt to continue with thicker liquids for maximal airway protection.  Hopeful for dietary advancement ability.     HPI Other Pertinent Information: 69 y/o man with stage IV lung adeno CA admitted with sepsis and AMS after recent D/C 8/24 to SNF after admission for seizures. During this admission, pt was evaluated and recommended to have Dys 3 diet and nectar thick liquids due to coughing with thin liquids. PMHx of HTN, cerebral aneurysm  (with rupture), COPD, SDH, scoliosis of lumbar spine, lung CA with brain mets, and left crainiotomy. MBS recommended to assess full swallow function.    Pertinent Vitals Pain Assessment: No/denies pain  SLP Plan  MBS (next am due to improved swallow function and plans for dc next date)    Recommendations Diet recommendations: Dysphagia 3 (mechanical soft);Nectar-thick liquid Liquids provided via: Cup Medication Administration: Whole meds with puree Supervision: Patient able to self feed Compensations: Slow rate;Small sips/bites Postural Changes and/or Swallow Maneuvers: Seated upright 90 degrees;Upright 30-60 min after meal              Oral Care Recommendations: Oral care BID Follow up Recommendations: Other (comment) (follow up at next venue of care) Plan: MBS (next am due to improved swallow function and plans for dc next date)    [place], [place], [place] SLP [phone]

## 2015-01-01 NOTE — Progress Notes (Signed)
Physical Therapy Treatment Patient Details Name: Frank Krueger MRN: [id number] DOB: May 09, 1945 Today's Date: 01/01/2015    History of Present Illness 69 y/o man with stage IV lung adeno CA admitted with sepsis and AMS after recent D/C 8/24 to SNF after admission for seizures. PMHx of HTN, cerebral aneurysm (with rupture), COPD, SDH, scoliosis of lumbar spine, lung CA with brain mets, and left crainiotomy.     PT Comments    Frank Krueger became hypotensive and was symptomatic upon standing and therefore session was limited.  Required min assist for sit<>stand and stand pivot transfer 2/2 c/o dizziness.  RN notified of pt's BP readings. Pt will benefit from continued skilled PT services to increase functional independence and safety.   Follow Up Recommendations  Supervision/Assistance - 24 hour;Home health PT     Equipment Recommendations  Rolling walker with 5" wheels    Recommendations for Other Services       Precautions / Restrictions Precautions Precautions: Fall Precaution Comments: impulsive with decreased safety awareness Restrictions Weight Bearing Restrictions: No    Mobility  Bed Mobility Overal bed mobility: Needs Assistance Bed Mobility: Sit to Supine       Sit to supine: Min assist   General bed mobility comments: Min assist to bring Bil LEs into bed and assist to scoot toward HOB.    Transfers Overall transfer level: Needs assistance Equipment used: Rolling walker (2 wheeled) Transfers: Sit to/from [facility] Sit to Stand: Min assist Stand pivot transfers: Min assist       General transfer comment: Min assist during sit<>stand and stand pivot to stabilize RW and min assist to power up to standing from recliner chair.  Pt becomes dizzy upon standing and BP taken (132/69 sitting, 103/49 standing, pt symptomatic)  Ambulation/Gait Ambulation/Gait assistance: Min assist Ambulation Distance (Feet): 5 Feet Assistive device: Rolling walker  (2 wheeled) Gait Pattern/deviations: Step-through pattern;Shuffle;Decreased stride length;Antalgic;Trunk flexed Gait velocity: Decreased Gait velocity interpretation: Below normal speed for age/gender General Gait Details: Pt shuffles to bed w/ min assist for directing and stabilizing RW.  Pt c/o dizziness but says "I want to get to bed"   Stairs            Wheelchair Mobility    Modified Rankin (Stroke Patients Only)       Balance Overall balance assessment: Needs assistance Sitting-balance support: No upper extremity supported;Feet supported Sitting balance-Leahy Scale: Fair     Standing balance support: Bilateral upper extremity supported;During functional activity Standing balance-Leahy Scale: Poor                      Cognition Arousal/Alertness: Awake/alert Behavior During Therapy: Impulsive Overall Cognitive Status: Within Functional Limits for tasks assessed           Safety/Judgement: Decreased awareness of safety;Decreased awareness of deficits     General Comments: Pt is motivated to work and is frustrated when he begins feeling dizzy.    Exercises General Exercises - Lower Extremity Ankle Circles/Pumps: AROM;Both;10 reps;Seated Long Arc Quad: AROM;Both;10 reps;Seated Hip Flexion/Marching: AROM;Both;10 reps;Seated    General Comments General comments (skin integrity, edema, etc.): Session limited as pt became hypotensive and symptomatic in standing.  At end of session pt c/o platar surface of Rt foot feeling tingly and when asked when this began he said 2-3 years ago.  RN notified and daughter asked to tell RN if pt mentions any other symptoms.  Strength and cognition at baseline and pt denies any other weakness  or tingling symptoms.      Pertinent Vitals/Pain Pain Assessment: No/denies pain    Home Living                      Prior Function            PT Goals (current goals can now be found in the care plan section) Acute  Rehab PT Goals Patient Stated Goal: to get to the bed PT Goal Formulation: With patient Time For Goal Achievement: 01/07/15 Potential to Achieve Goals: Good Progress towards PT goals: Progressing toward goals    Frequency  Min 3X/week    PT Plan Current plan remains appropriate    Co-evaluation             End of Session Equipment Utilized During Treatment: Gait belt Activity Tolerance: Treatment limited secondary to medical complications (Comment) (hypotensive) Patient left: in bed;with call bell/phone within reach;with family/visitor present (with MD and CSW in room)     Time: 1520-1550 PT Time Calculation (min) (ACUTE ONLY): 30 min  Charges:  $Therapeutic Exercise: 8-22 mins $Therapeutic Activity: 8-22 mins                    G Codes:      Joslyn Hy PT, DPT [phone] Pager: [phone] 01/01/2015, 4:53 PM

## 2015-01-01 NOTE — Progress Notes (Signed)
  Radiation Oncology         [phone] ________________________________  Name: Frank Krueger MRN: [id number]  Date: 12/13/2014  DOB: [birth year]  End of Treatment Note  Diagnosis:   Metastatic cancer with brain metastasis     Indication for treatment::  palliative       Radiation treatment dates:   11/30/2014 through 12/13/2014  Site/dose:   The patient was treated with a course of whole brain radiation treatment to a dose of 30 gray in 10 fractions at 3 gray per fraction. This was accomplished using left and right whole brain radiation fields.  Narrative: The patient tolerated radiation treatment relatively well.   No unexpected difficulties were encountered during the patient's course of radiation treatment.  Plan: The patient has completed radiation treatment. The patient will return to radiation oncology clinic for routine followup in one month. I advised the patient to call or return sooner if they have any questions or concerns related to their recovery or treatment. ________________________________  Jodelle Gross, M.D., Ph.D.

## 2015-01-01 NOTE — Progress Notes (Signed)
  Radiation Oncology         [phone] ________________________________  Name: Frank Krueger MRN: [id number]  Date: 11/27/2014  DOB: 05-14-1945    Simulation and treatment planning note  The patient presented for simulation for the patient's upcoming course of whole brain radiation treatment. The patient was placed in a supine position and a customized thermoplastic head cast was constructed to aid in patient immobilization during the treatment. This complex treatment device will be used on a daily basis. In this fashion a CT scan was obtained through the head and neck region and isocenter was placed near midline within the brain.  The patient will be planned to receive a course of whole brain radiation treatment to a dose of 30 gray in 10 fractions at 3 gray per fraction. To accomplish this, 2 customized blocks have been designed which corresponds to left and right whole brain radiation fields. These 2 complex treatment devices will be used on a daily basis during the course of radiation. A complex isodose plan is requested to insure that the target area is adequately covered in to facilitate optimization of the treatment plan. A forward planning technique will also be evaluated to determine if this approach significantly improves the plan.   ________________________________   Jodelle Gross, MD, PhD

## 2015-01-01 NOTE — Progress Notes (Signed)
[place] TEAM 1 - Stepdown/ICU TEAM PROGRESS NOTE  Frank Krueger:[id number] DOB: 04-17-1946 DOA: 12/21/2014 PCP: No primary care provider on file.  Admit HPI / Brief Narrative: 69 y.o. 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 During that stay he was intubated. Ultimately discharged from hospital 8/24 to a rehab facility. At the rehab facility he was noted to have progressive confusion and was brought back to the ED on 8/25 for further evaluation.  HPI/Subjective: In good spirit this am, sitting in chair, no complaints, wanting to go home. denies pain, no fever, daughter in room  Assessment/Plan:  Sepsis - source unclear  - UA suggestive of UTI, urine culture with multiple species and recollection noted only yeast (usually not a pathogen)  - stopped abx tx 8/28 and following clinically - repeat UA 8/30 again noted multiple yeast - will dose w/ short course of diflucan given need for ongoing high dose immunosuppressant  - pt improving clinically - sepsis physiology resolved -repeat ua due to drowsy and fatigue on 9/4, repeat ua no infection, no yeast -wbc remain elevated, likely secondary to steroids.  Intermittent delirium - Mental status waxes and wanes mildly but overall the patient is improving nicely -drowsy, not oriented to time on 9/4, delirium? -better on 9/5  Diabetes type 2 -  severely uncontrolled  - CBG severely elevated in setting of ongoing high dose steroids therapy - presently it is not felt to be safe to taper his steroids therefore we will increase his insulin dosing again today  - the patient was not previously on insulin but will clearly need to go home on insulin as long as Decadron is continued  -a1c 7.7 in 11/2014  Diarrhea - resolved - pt is now having formed stools   Nausea and vomiting  - resolved - tolerating diet well  - XRAY  with no signs of obstruction / illeus, having bm  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  - Patient has just completed a course of whole brain XRT (?last tx 8/15) + high-dose steroid  - Continue patient's current Decadron dose 4 mg daily(family reports that prior attempts to wean to 54m were met w/ seizure activity)  - chemotherapy scheduled next week, right sided port inplace.  Seizure disorder - Continue Keppra 1500 mg BID  Anemia of chronic disease, malignancy - no signs of active bleeding - Hgb stable - transfuse for Hgb < 7  Hyponatremia - ?SIADH related to lung CA - appears to vary between 125-130 - likely also a component of pseudohyponatremia due to severe hyperglycemia - fluids restriction, salt tabs -ns fluctuation between 121 to 129.  Hypomagnesemia - Likely due to poor oral intake - continue to supplement as needed -iv mag in the hospital Will discharge with oral mag supplement.  Moderate pulmonary hypertension - normal EF per TTE 12/23/14 - weight is 71.1? kg which is down from 78.8 kg on admission - monitor daily weights and strict I/Os - no evidence of gross volume overload at this time  Malnutrition, severe in the setting of chronic illness: nutrition supplement  Dysphagia Patient diagnosed with dysphagia at previous admission - SLP reevaluation at [facility] during this hospitalization and patient continues to require modified diet as ordered, soft diet and nectar thick liquid.  Code Status: FULL Family Communication: Spoke with daughter at bedside at length  Disposition  Plan: The plan was to discharge the patient home on 9/1 with all arrangements made for the patient to be transported from the hospital to the [facility] medical clinic to be evaluated and have his prescriptions written by [facility] doctor and then for the patient to be transferred home - we were contacted by the equipment provider arranged to the [facility] and informed that is necessary medical equipment  would not be delivered to the house today - as a result the patient missed his window for discharge home - the earliest is arrangements can be accomplished in the interim is Tuesday - the patient will therefore be transferred to a medical bed for ongoing monitoring and titration of diabetes medications over the long weekend with plan to attempt discharge again Tuesday morning  Consultants: None  Procedures: None  Antibiotics: Vancomycin 8/25 > 8/28 Levaquin 8/25 > 8/28  DVT prophylaxis: Lovenox  Objective: Blood pressure 138/66, pulse 85, temperature 98 F (36.7 C), temperature source Oral, resp. rate 19, height 6' (1.829 m), weight 156 lb 12 oz (71.1 kg), SpO2 96 %.  Intake/Output Summary (Last 24 hours) at 01/01/15 1312 Last data filed at 01/01/15 0900  Gross per 24 hour  Intake    720 ml  Output   1150 ml  Net   -430 ml   Exam: General: No acute respiratory distress Lungs: Clear to auscultation bilaterally without wheezes or crackles, right sided port in place. Cardiovascular: Regular rate and rhythm Abdomen: Nontender, nondistended, soft, bowel sounds positive, no rebound, no ascites, no appreciable mass Extremities: No significant cyanosis, clubbing, or edema bilateral lower extremities Skin: duoderm to sarcum Neuro: intermittent confusion, hard of hearing at baseline  Data Reviewed: Basic Metabolic Panel:  Recent Labs Lab 12/26/14 0219 12/27/14 0259 12/28/14 0223 12/31/14 0709 01/01/15 0348  NA 123* 129* 121* 123* 125*  K 4.5 5.3* 4.6 4.5 4.8  CL 85* 90* 83* 87* 88*  CO2 _0 GLUCOSE 439* 312* 284* 163* 219*  BUN 24* 22* _1 CREATININE 0.72 0.67 0.59* 0.54* 0.63  CALCIUM 7.9* 8.7* 8.5* 8.7* 8.4*  MG  --  1.6* 1.7 1.4* 1.6*    CBC:  Recent Labs Lab 12/26/14 0219 12/27/14 0259 12/31/14 0709 01/01/15 0348  WBC 15.3* 19.7* 26.7* 27.1*  NEUTROABS  --  18.1*  --  24.6*  HGB 8.4* 9.1* 8.3* 8.7*  HCT 26.3* 28.7* 25.6* 26.9*  MCV 74.9*  75.1* 73.6* 73.9*  PLT 333 349 463* 473*    Liver Function Tests:  Recent Labs Lab 12/26/14 0219 12/27/14 0259 01/01/15 0348  AST 21 25 42*  ALT 37 38 54  ALKPHOS 156* 161* 207*  BILITOT 0.5 0.6 0.4  PROT 5.5* 6.1* 5.8*  ALBUMIN 1.4* 1.5* 1.2*   CBG:  Recent Labs Lab 12/31/14 0744 12/31/14 1225 12/31/14 1702 12/31/14 2225 01/01/15 0814  GLUCAP 149* 219* 198* 318* 149*    Recent Results (from the past 240 hour(s))  Culture, expectorated sputum-assessment     Status: None   Collection Time: 12/22/14  1:52 PM  Result Value Ref Range Status   Specimen Description EXPECTORATED SPUTUM  Final   Special Requests Immunocompromised  Final   Sputum evaluation   Final    THIS SPECIMEN IS ACCEPTABLE. RESPIRATORY CULTURE REPORT TO FOLLOW.   Report Status 12/22/2014 FINAL  Final  Culture, respiratory (NON-Expectorated)     Status: None   Collection Time: 12/22/14  1:52 PM  Result Value Ref Range Status  Specimen Description SPUTUM  Final   Special Requests NONE  Final   Gram Stain   Final    FEW WBC PRESENT, PREDOMINANTLY MONONUCLEAR RARE SQUAMOUS EPITHELIAL CELLS PRESENT FEW GRAM POSITIVE COCCI IN PAIRS IN CLUSTERS RARE GRAM POSITIVE RODS RARE GRAM NEGATIVE RODS    Culture   Final    NORMAL OROPHARYNGEAL FLORA Performed at [facility]    Report Status 12/25/2014 FINAL  Final  Urine culture     Status: None   Collection Time: 12/23/14  9:55 PM  Result Value Ref Range Status   Specimen Description URINE, CLEAN CATCH  Final   Special Requests NONE  Final   Culture >=100,000 COLONIES/mL YEAST  Final   Report Status 12/25/2014 FINAL  Final  Culture, Urine     Status: None   Collection Time: 12/27/14  4:59 PM  Result Value Ref Range Status   Specimen Description URINE, CLEAN CATCH  Final   Special Requests NONE  Final   Culture >=100,000 COLONIES/mL YEAST  Final   Report Status 12/29/2014 FINAL  Final     Studies:   Recent x-ray studies have been  reviewed in detail by the Attending Physician  Scheduled Meds:  Scheduled Meds: . atorvastatin  40 mg Oral q1800  . clopidogrel  75 mg Oral Daily  . dexamethasone  4 mg Oral QID  . enoxaparin (LOVENOX) injection  40 mg Subcutaneous Q24H  . famotidine  20 mg Oral BID  . feeding supplement (GLUCERNA SHAKE)  237 mL Oral TID BM  . fluticasone  2 spray Each Nare Daily  . gabapentin  300 mg Oral BID  . insulin aspart  0-20 Units Subcutaneous TID WC  . insulin aspart  0-5 Units Subcutaneous QHS  . insulin aspart  10 Units Subcutaneous TID WC  . insulin glargine  40 Units Subcutaneous BID  . levETIRAcetam  1,500 mg Oral BID  . loratadine  10 mg Oral Daily  . polyvinyl alcohol  1 drop Both Eyes QID  . sodium chloride  1 g Oral BID WC    Time spent on care of this patient: 25 mins   Salam Micucci , MD PhD  [facility] Office  [phone] Pager - Text Page per Amion as per below:  On-Call/Text Page:      [email or web address]      password [email or web address]  If 7PM-7AM, please contact night-coverage [email or web address] Password [email or web address] 01/01/2015, 1:12 PM   LOS: 11 days

## 2015-01-02 ENCOUNTER — Inpatient Hospital Stay (HOSPITAL_COMMUNITY): Payer: Non-veteran care

## 2015-01-02 ENCOUNTER — Inpatient Hospital Stay (HOSPITAL_COMMUNITY)
Admission: EM | Admit: 2015-01-02 | Discharge: 2015-01-09 | Disposition: A | Discharge status: Home / Self Care | Payer: Non-veteran care | Source: Home / Self Care | Attending: Internal Medicine | Admitting: Internal Medicine

## 2015-01-02 ENCOUNTER — Encounter (HOSPITAL_COMMUNITY): Payer: Self-pay | Admitting: Emergency Medicine

## 2015-01-02 DIAGNOSIS — G40909 Epilepsy, unspecified, not intractable, without status epilepticus: Secondary | ICD-10-CM

## 2015-01-02 DIAGNOSIS — C7951 Secondary malignant neoplasm of bone: Secondary | ICD-10-CM

## 2015-01-02 DIAGNOSIS — J9601 Acute respiratory failure with hypoxia: Secondary | ICD-10-CM

## 2015-01-02 DIAGNOSIS — Z7189 Other specified counseling: Secondary | ICD-10-CM | POA: Insufficient documentation

## 2015-01-02 DIAGNOSIS — J189 Pneumonia, unspecified organism: Secondary | ICD-10-CM

## 2015-01-02 DIAGNOSIS — A419 Sepsis, unspecified organism: Secondary | ICD-10-CM

## 2015-01-02 DIAGNOSIS — E1165 Type 2 diabetes mellitus with hyperglycemia: Secondary | ICD-10-CM | POA: Diagnosis present

## 2015-01-02 DIAGNOSIS — C3490 Malignant neoplasm of unspecified part of unspecified bronchus or lung: Secondary | ICD-10-CM | POA: Diagnosis present

## 2015-01-02 DIAGNOSIS — I951 Orthostatic hypotension: Secondary | ICD-10-CM

## 2015-01-02 DIAGNOSIS — R509 Fever, unspecified: Secondary | ICD-10-CM

## 2015-01-02 DIAGNOSIS — E119 Type 2 diabetes mellitus without complications: Secondary | ICD-10-CM

## 2015-01-02 DIAGNOSIS — E162 Hypoglycemia, unspecified: Secondary | ICD-10-CM

## 2015-01-02 DIAGNOSIS — C7931 Secondary malignant neoplasm of brain: Secondary | ICD-10-CM | POA: Diagnosis present

## 2015-01-02 DIAGNOSIS — Z515 Encounter for palliative care: Secondary | ICD-10-CM | POA: Insufficient documentation

## 2015-01-02 DIAGNOSIS — IMO0002 Reserved for concepts with insufficient information to code with codable children: Secondary | ICD-10-CM | POA: Diagnosis present

## 2015-01-02 DIAGNOSIS — E871 Hypo-osmolality and hyponatremia: Secondary | ICD-10-CM | POA: Diagnosis present

## 2015-01-02 DIAGNOSIS — N390 Urinary tract infection, site not specified: Secondary | ICD-10-CM | POA: Insufficient documentation

## 2015-01-02 LAB — URINALYSIS, ROUTINE W REFLEX MICROSCOPIC
BILIRUBIN URINE: NEGATIVE
Glucose, UA: 500 mg/dL — AB
Hgb urine dipstick: NEGATIVE
Ketones, ur: NEGATIVE mg/dL
LEUKOCYTES UA: NEGATIVE
NITRITE: POSITIVE — AB
PH: 6.5 (ref 5.0–8.0)
Protein, ur: NEGATIVE mg/dL
SPECIFIC GRAVITY, URINE: 1.02 (ref 1.005–1.030)
UROBILINOGEN UA: 1 mg/dL (ref 0.0–1.0)

## 2015-01-02 LAB — BASIC METABOLIC PANEL
ANION GAP: 10 (ref 5–15)
BUN: 20 mg/dL (ref 6–20)
CALCIUM: 8.6 mg/dL — AB (ref 8.9–10.3)
CHLORIDE: 85 mmol/L — AB (ref 101–111)
CO2: 25 mmol/L (ref 22–32)
Creatinine, Ser: 0.64 mg/dL (ref 0.61–1.24)
GFR calc Af Amer: >60 mL/min (ref >60)
GFR calc non Af Amer: >60 mL/min (ref >60)
GLUCOSE: 371 mg/dL — AB (ref 65–99)
Potassium: 4.6 mmol/L (ref 3.5–5.1)
Sodium: 120 mmol/L — ABNORMAL LOW (ref 135–145)

## 2015-01-02 LAB — CBC
HCT: 26.3 % — ABNORMAL LOW (ref 39.0–52.0)
HEMATOCRIT: 27.4 % — AB (ref 39.0–52.0)
HEMOGLOBIN: 8.7 g/dL — AB (ref 13.0–17.0)
HEMOGLOBIN: 8.8 g/dL — AB (ref 13.0–17.0)
MCH: 24.4 pg — ABNORMAL LOW (ref 26.0–34.0)
MCH: 23.7 pg — ABNORMAL LOW (ref 26.0–34.0)
MCHC: 33.1 g/dL (ref 30.0–36.0)
MCHC: 32.1 g/dL (ref 30.0–36.0)
MCV: 73.7 fL — ABNORMAL LOW (ref 78.0–100.0)
MCV: 73.7 fL — AB (ref 78.0–100.0)
Platelets: 429 10*3/uL — ABNORMAL HIGH (ref 150–400)
Platelets: 462 10*3/uL — ABNORMAL HIGH (ref 150–400)
RBC: 3.57 MIL/uL — AB (ref 4.22–5.81)
RBC: 3.72 MIL/uL — ABNORMAL LOW (ref 4.22–5.81)
RDW: 18.8 % — ABNORMAL HIGH (ref 11.5–15.5)
RDW: 18.9 % — AB (ref 11.5–15.5)
WBC: 26.6 10*3/uL — AB (ref 4.0–10.5)
WBC: 23.9 10*3/uL — AB (ref 4.0–10.5)

## 2015-01-02 LAB — COMPREHENSIVE METABOLIC PANEL
ALBUMIN: 1.2 g/dL — AB (ref 3.5–5.0)
ALK PHOS: 226 U/L — AB (ref 38–126)
ALT: 65 U/L — AB (ref 17–63)
ANION GAP: 9 (ref 5–15)
AST: 50 U/L — ABNORMAL HIGH (ref 15–41)
BUN: 17 mg/dL (ref 6–20)
CALCIUM: 8.6 mg/dL — AB (ref 8.9–10.3)
CHLORIDE: 89 mmol/L — AB (ref 101–111)
CO2: 28 mmol/L (ref 22–32)
CREATININE: 0.57 mg/dL — AB (ref 0.61–1.24)
GFR calc Af Amer: >60 mL/min (ref >60)
GFR calc non Af Amer: >60 mL/min (ref >60)
GLUCOSE: 165 mg/dL — AB (ref 65–99)
Potassium: 4.8 mmol/L (ref 3.5–5.1)
SODIUM: 126 mmol/L — AB (ref 135–145)
Total Bilirubin: 0.3 mg/dL (ref 0.3–1.2)
Total Protein: 5.7 g/dL — ABNORMAL LOW (ref 6.5–8.1)

## 2015-01-02 LAB — HEMOGLOBIN A1C
HEMOGLOBIN A1C: 8.6 % — AB (ref 4.8–5.6)
MEAN PLASMA GLUCOSE: 200 mg/dL

## 2015-01-02 LAB — CBG MONITORING, ED: Glucose-Capillary: 396 mg/dL — ABNORMAL HIGH (ref 65–99)

## 2015-01-02 LAB — URINE MICROSCOPIC-ADD ON

## 2015-01-02 LAB — MAGNESIUM: MAGNESIUM: 1.6 mg/dL — AB (ref 1.7–2.4)

## 2015-01-02 LAB — GLUCOSE, CAPILLARY
GLUCOSE-CAPILLARY: 186 mg/dL — AB (ref 65–99)
Glucose-Capillary: 121 mg/dL — ABNORMAL HIGH (ref 65–99)
Glucose-Capillary: 112 mg/dL — ABNORMAL HIGH (ref 65–99)

## 2015-01-02 MED ORDER — ENOXAPARIN SODIUM 40 MG/0.4ML ~~LOC~~ SOLN
40.0000 mg | SUBCUTANEOUS | Status: DC
Start: 2015-01-02 — End: 2015-01-08
  Administered 2015-01-02 – 2015-01-07 (×6): 40 mg via SUBCUTANEOUS
  Filled 2015-01-02 (×6): qty 0.4

## 2015-01-02 MED ORDER — GABAPENTIN 300 MG PO CAPS
300.0000 mg | ORAL_CAPSULE | Freq: Two times a day (BID) | ORAL | Status: DC
  Administered 2015-01-02 – 2015-01-04 (×5): 300 mg via ORAL
  Filled 2015-01-02 (×5): qty 1

## 2015-01-02 MED ORDER — INSULIN ASPART 100 UNIT/ML ~~LOC~~ SOLN: 14.0000 [IU] | Freq: Three times a day (TID) | SUBCUTANEOUS | Status: DC

## 2015-01-02 MED ORDER — ACETAMINOPHEN 650 MG RE SUPP: 650.0000 mg | Freq: Four times a day (QID) | RECTAL | Status: DC | PRN

## 2015-01-02 MED ORDER — MAGNESIUM CHLORIDE 64 MG PO TBEC
1.0000 | DELAYED_RELEASE_TABLET | Freq: Two times a day (BID) | ORAL | Status: DC
  Administered 2015-01-02 – 2015-01-08 (×12): 64 mg via ORAL
  Filled 2015-01-02 (×15): qty 1

## 2015-01-02 MED ORDER — INSULIN ASPART 100 UNIT/ML ~~LOC~~ SOLN
0.0000 [IU] | Freq: Three times a day (TID) | SUBCUTANEOUS | Status: DC
  Administered 2015-01-03 (×2): 4 [IU] via SUBCUTANEOUS
  Administered 2015-01-04: 11 [IU] via SUBCUTANEOUS
  Administered 2015-01-04: 15 [IU] via SUBCUTANEOUS
  Administered 2015-01-04: 7 [IU] via SUBCUTANEOUS
  Administered 2015-01-05: 20 [IU] via SUBCUTANEOUS
  Administered 2015-01-05 (×2): 11 [IU] via SUBCUTANEOUS
  Administered 2015-01-06 (×2): 20 [IU] via SUBCUTANEOUS
  Administered 2015-01-06: 15 [IU] via SUBCUTANEOUS

## 2015-01-02 MED ORDER — ALUM & MAG HYDROXIDE-SIMETH 200-200-20 MG/5ML PO SUSP: 30.0000 mL | Freq: Four times a day (QID) | ORAL | Status: DC | PRN

## 2015-01-02 MED ORDER — INSULIN PEN NEEDLE 29G X 12MM MISC: 38.0000 [IU] | Freq: Every day | Status: DC

## 2015-01-02 MED ORDER — INSULIN ASPART 100 UNIT/ML FLEXPEN: 14.0000 [IU] | PEN_INJECTOR | Freq: Three times a day (TID) | SUBCUTANEOUS | Status: DC

## 2015-01-02 MED ORDER — SODIUM CHLORIDE 0.9 % IJ SOLN
10.0000 mL | INTRAMUSCULAR | Status: DC | PRN
  Administered 2015-01-03 – 2015-01-06 (×6): 10 mL
  Filled 2015-01-02 (×6): qty 40

## 2015-01-02 MED ORDER — PANTOPRAZOLE SODIUM 40 MG PO TBEC
40.0000 mg | DELAYED_RELEASE_TABLET | Freq: Every day | ORAL | Status: DC
  Administered 2015-01-03 – 2015-01-08 (×6): 40 mg via ORAL
  Filled 2015-01-02 (×6): qty 1

## 2015-01-02 MED ORDER — ACETAMINOPHEN 325 MG PO TABS
650.0000 mg | ORAL_TABLET | Freq: Four times a day (QID) | ORAL | Status: DC | PRN
  Administered 2015-01-02 – 2015-01-08 (×8): 650 mg via ORAL
  Filled 2015-01-02 (×7): qty 2

## 2015-01-02 MED ORDER — INSULIN GLARGINE 100 UNIT/ML ~~LOC~~ SOLN
40.0000 [IU] | Freq: Two times a day (BID) | SUBCUTANEOUS | Status: DC
  Administered 2015-01-02: 40 [IU] via SUBCUTANEOUS
  Filled 2015-01-02 (×2): qty 0.4

## 2015-01-02 MED ORDER — SENNOSIDES-DOCUSATE SODIUM 8.6-50 MG PO TABS: 1.0000 | ORAL_TABLET | Freq: Every evening | ORAL | Status: DC | PRN

## 2015-01-02 MED ORDER — ATORVASTATIN CALCIUM 40 MG PO TABS
40.0000 mg | ORAL_TABLET | Freq: Every day | ORAL | Status: DC
  Administered 2015-01-03 – 2015-01-04 (×2): 40 mg via ORAL
  Filled 2015-01-02 (×2): qty 1

## 2015-01-02 MED ORDER — MAGNESIUM 30 MG PO TABS: 30.0000 mg | ORAL_TABLET | Freq: Two times a day (BID) | ORAL | Status: DC

## 2015-01-02 MED ORDER — SODIUM CHLORIDE 1 G PO TABS
1.0000 g | ORAL_TABLET | Freq: Two times a day (BID) | ORAL | Status: DC
  Administered 2015-01-02 – 2015-01-07 (×11): 1 g via ORAL
  Filled 2015-01-02 (×15): qty 1

## 2015-01-02 MED ORDER — INSULIN ASPART 100 UNIT/ML ~~LOC~~ SOLN
0.0000 [IU] | Freq: Every day | SUBCUTANEOUS | Status: DC
  Administered 2015-01-03 – 2015-01-04 (×2): 4 [IU] via SUBCUTANEOUS
  Administered 2015-01-05: 3 [IU] via SUBCUTANEOUS
  Administered 2015-01-06: 4 [IU] via SUBCUTANEOUS

## 2015-01-02 MED ORDER — CLOPIDOGREL BISULFATE 75 MG PO TABS
75.0000 mg | ORAL_TABLET | Freq: Every day | ORAL | Status: DC
  Administered 2015-01-03 – 2015-01-08 (×6): 75 mg via ORAL
  Filled 2015-01-02 (×6): qty 1

## 2015-01-02 MED ORDER — LEVETIRACETAM 750 MG PO TABS
1500.0000 mg | ORAL_TABLET | Freq: Two times a day (BID) | ORAL | Status: DC
  Administered 2015-01-02 – 2015-01-09 (×14): 1500 mg via ORAL
  Filled 2015-01-02 (×19): qty 2

## 2015-01-02 MED ORDER — INSULIN GLARGINE 100 UNIT/ML SOLOSTAR PEN: 40.0000 [IU] | PEN_INJECTOR | Freq: Two times a day (BID) | SUBCUTANEOUS | Status: DC

## 2015-01-02 MED ORDER — DEXAMETHASONE 4 MG PO TABS
4.0000 mg | ORAL_TABLET | Freq: Four times a day (QID) | ORAL | Status: DC
  Administered 2015-01-02 – 2015-01-09 (×25): 4 mg via ORAL
  Filled 2015-01-02 (×30): qty 1

## 2015-01-02 MED ORDER — IBUPROFEN 400 MG PO TABS
400.0000 mg | ORAL_TABLET | Freq: Once | ORAL | Status: AC
  Administered 2015-01-02: 400 mg via ORAL
  Filled 2015-01-02: qty 1

## 2015-01-02 MED ORDER — SODIUM CHLORIDE 0.9 % IV BOLUS (SEPSIS)
500.0000 mL | Freq: Once | INTRAVENOUS | Status: AC
Start: 2015-01-02 — End: 2015-01-02
  Administered 2015-01-02: 500 mL via INTRAVENOUS

## 2015-01-02 MED ORDER — SODIUM CHLORIDE 0.9 % IV BOLUS (SEPSIS)
1000.0000 mL | Freq: Once | INTRAVENOUS | Status: AC
  Administered 2015-01-02: 1000 mL via INTRAVENOUS

## 2015-01-02 MED ORDER — ONDANSETRON HCL 4 MG PO TABS: 4.0000 mg | ORAL_TABLET | Freq: Four times a day (QID) | ORAL | Status: DC | PRN

## 2015-01-02 MED ORDER — GLUCERNA SHAKE PO LIQD
237.0000 mL | Freq: Three times a day (TID) | ORAL | Status: DC
  Administered 2015-01-02 – 2015-01-04 (×6): 237 mL via ORAL

## 2015-01-02 MED ORDER — ONDANSETRON HCL 4 MG/2ML IJ SOLN
4.0000 mg | Freq: Four times a day (QID) | INTRAMUSCULAR | Status: DC | PRN
  Administered 2015-01-03: 4 mg via INTRAVENOUS
  Filled 2015-01-02: qty 2

## 2015-01-02 NOTE — Progress Notes (Signed)
Mild oral phase dysphagia;Mild pharyngeal phase dysphagia   Clinical Impression Improved swallow function compared to prior evaluation without aspiration of any consistency tested. Delayed oral transiting noted with decreased coordination with liquids mostly. Minimal residuals in pharynx noted without pt awareness. Liquid wash helpful to decrease residuals. Cues to dry swallow also helpful. SLP recommended to advance diet to dys3/thin with precautions. Using live video, educated pt and daughter, reinforcing effective compensation strategies.       [facility] IP TREATMENT RECOMMENDATION 12/28/2014  Treatment Recommendations Therapy as outlined in treatment plan below     [facility] IP DIET RECOMMENDATION 01/02/2015  SLP Diet Recommendations Dysphagia 3 (Mech soft);Thin  Liquid Administration via Cup, straw  Medication Administration Whole meds with puree  Compensations Slow rate;Small sips/bites;Follow solids with liquid, intermittent dry swallow  Postural Changes and/or Swallow Maneuvers (None)        Luanna Salk, [facility] SLP [phone]

## 2015-01-02 NOTE — ED Notes (Signed)
Attempted report 
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
## 2015-01-02 NOTE — Progress Notes (Signed)
Physical Therapy Treatment Patient Details Name: Frank Krueger MRN: [id number] DOB: 03-18-1946 Today's Date: 01/02/2015    History of Present Illness 69 y/o man with stage IV lung adeno CA admitted with sepsis and AMS after recent D/C 8/24 to SNF after admission for seizures. PMHx of HTN, cerebral aneurysm (with rupture), COPD, SDH, scoliosis of lumbar spine, lung CA with brain mets, and left crainiotomy.     PT Comments    Mr. Marykay Lex continues to have a dec awareness of his balance deficits and requires occassional min assist during ambulation 2/2 instability.  Pt is anticipating d/c home w/ 24/7 assist from family and friends.  Pt will benefit from continued skilled PT services to increase functional independence and safety.   Follow Up Recommendations  Supervision/Assistance - 24 hour;Home health PT     Equipment Recommendations  Rolling walker with 5" wheels    Recommendations for Other Services       Precautions / Restrictions Precautions Precautions: Fall Precaution Comments: impulsive with decreased safety awareness Restrictions Weight Bearing Restrictions: No    Mobility  Bed Mobility Overal bed mobility: Needs Assistance Bed Mobility: Supine to Sit     Supine to sit: Modified independent (Device/Increase time);HOB elevated     General bed mobility comments: Mod I w/ increased time and HOB slightly elevated  Transfers Overall transfer level: Needs assistance Equipment used: Rolling walker (2 wheeled) Transfers: Sit to/from Stand Sit to Stand: Min assist         General transfer comment: Min assist to stabilize during sit<>stand and increased time for pt to sequence.   Ambulation/Gait Ambulation/Gait assistance: Min assist Ambulation Distance (Feet): 80 Feet Assistive device: Rolling walker (2 wheeled) Gait Pattern/deviations: Step-through pattern;Decreased stride length;Antalgic;Trunk flexed;Shuffle Gait velocity: Decreased Gait velocity  interpretation: <1.8 ft/sec, indicative of risk for recurrent falls General Gait Details: Pt w/ occassional shuffling and Lt knee hyperextension.  Requires occassional min assist to maintain stability.     Stairs            Wheelchair Mobility    Modified Rankin (Stroke Patients Only)       Balance Overall balance assessment: Needs assistance Sitting-balance support: Bilateral upper extremity supported;Feet supported Sitting balance-Leahy Scale: Fair     Standing balance support: Bilateral upper extremity supported;During functional activity Standing balance-Leahy Scale: Poor Standing balance comment: not good awareness of instability and requires min assist to stabilize at time when using RW                    Cognition Arousal/Alertness: Awake/alert Behavior During Therapy: Impulsive Overall Cognitive Status: Impaired/Different from baseline Area of Impairment: Safety/judgement;Awareness;Memory;Attention   Current Attention Level: Sustained Memory: Decreased recall of precautions;Decreased short-term memory Following Commands: Follows one step commands with increased time Safety/Judgement: Decreased awareness of safety;Decreased awareness of deficits Awareness: Intellectual Problem Solving: Slow processing;Difficulty sequencing General Comments: Pt is not aware of deficits and dec safety w/ ambulation    Exercises General Exercises - Lower Extremity Ankle Circles/Pumps: AROM;Both;10 reps;Seated Long Arc Quad: AROM;Both;10 reps;Seated Hip Flexion/Marching: AROM;Both;10 reps;Seated    General Comments General comments (skin integrity, edema, etc.): Pt denies dizziness throughout duration of session      Pertinent Vitals/Pain Pain Assessment: No/denies pain Pain Score: 3  Pain Location: throat Pain Intervention(s): Limited activity within patient's tolerance;Monitored during session    Home Living                      Prior Function  PT Goals (current goals can now be found in the care plan section) Acute Rehab PT Goals Patient Stated Goal: to go home today PT Goal Formulation: With patient Time For Goal Achievement: 01/07/15 Potential to Achieve Goals: Good Progress towards PT goals: Progressing toward goals    Frequency  Min 3X/week    PT Plan Current plan remains appropriate    Co-evaluation             End of Session Equipment Utilized During Treatment: Gait belt Activity Tolerance: Patient limited by fatigue Patient left: with call bell/phone within reach;with family/visitor present;in chair;with chair alarm set (with MD and CSW in room)     Time: 1000-1023 PT Time Calculation (min) (ACUTE ONLY): 23 min  Charges:  $Gait Training: 8-22 mins $Therapeutic Exercise: 8-22 mins                    G Codes:      Joslyn Hy PT, [place] [phone] Pager: [phone] 01/02/2015, 12:31 PM

## 2015-01-02 NOTE — ED Notes (Signed)
Pt here via EMS from [facility] hospital with c/o hyperglycemia. Pt was here on 6N today and d/c to [place] for medications, then the pt was to go home with daughter[facility] and possible hyponatremia. Pt is alert but only oriented to self at this time. Pt has hx metastatic lung cancer to brain and liver. Pt denies pain at this time.

## 2015-01-02 NOTE — Progress Notes (Signed)
Inpatient Diabetes Program Recommendations  AACE/ADA: New Consensus Statement on Inpatient Glycemic Control (2013)  Target Ranges:  Prepandial:   less than 140 mg/dL      Peak postprandial:   less than 180 mg/dL (1-2 hours)      Critically ill patients:  140 - 180 mg/dL   GLYCEMIC CONTROL RECOMMENDATIONS     Results for Frank Krueger, Frank Krueger (MRN [id number]) as of 01/02/2015 15:57  Ref. Range 01/01/2015 12:28 01/01/2015 16:57 01/01/2015 21:25 01/02/2015 08:07 01/02/2015 13:05  Glucose-Capillary Latest Ref Range: 65-99 mg/dL 225 (H) 271 (H) 240 (H) 121 (H) 396 (H)   RECOMMENDATIONS: Lantus 40 units bid Novolog resistant tidwc and hs + 10 units tidwc  Will follow. Thank you. Lorenda Peck, RD, LDN, CDE Inpatient Diabetes Coordinator [phone]

## 2015-01-02 NOTE — Care Management (Signed)
Discharge summary faxed to Dr Armour Fax [phone] , phone [phone] ext [phone] , patient's [place] PCP. Magdalen Spatz RN BSN [phone]
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
## 2015-01-02 NOTE — Evaluation (Signed)
Occupational Therapy Evaluation Patient Details Name: Frank Krueger MRN: [id number] DOB: [birth year] Today's Date: 01/02/2015    History of Present Illness 69 y/o man with stage IV lung adeno CA admitted with sepsis and AMS after recent D/C 8/24 to SNF after admission for seizures. PMHx of HTN, cerebral aneurysm (with rupture), COPD, SDH, scoliosis of lumbar spine, lung CA with brain mets, and left crainiotomy.    Clinical Impression   PT admitted with seizures. Pt currently with functional limitiations due to the deficits listed below (see OT problem list). PTA from home alone with multiple falls and PRN (A) from family. Family plans to have hired help to (A) upon d/c but pt will not have 24/7 (A). Family requesting recommendations/ medication for the [place].  Pt will benefit from skilled OT to increase their independence and safety with adls and balance to allow discharge HHOT / Aide/ built up utensils from [place].     Follow Up Recommendations  Home health OT;Supervision/Assistance - 24 hour (family requesting home)    Equipment Recommendations  None recommended by OT  Pt is a high fall risk and family wants to d/c home. Pt could benefit from SNF level care.   Recommendations for Other Services Other (comment) (Palliative consult )     Precautions / Restrictions Precautions Precautions: Fall Precaution Comments: impulsive with decreased safety awareness      Mobility Bed Mobility Overal bed mobility: Needs Assistance Bed Mobility: Sit to Supine;Supine to Sit     Supine to sit: Min assist;HOB elevated Sit to supine: Min assist;HOB elevated   General bed mobility comments: needed mod cues to sequence task. Pt provided long pauses of time to problem solve with no progression. OT cueing again to help with movement toward EOB. Pt response "hang on give me a minute" pt with focused attention to task  Transfers Overall transfer level: Needs assistance Equipment used: Rolling walker  (2 wheeled) Transfers: Sit to/from Stand Sit to Stand: Mod assist         General transfer comment: cues for hand placement and not to pull back on RW    Balance Overall balance assessment: Needs assistance Sitting-balance support: Bilateral upper extremity supported;Feet supported Sitting balance-Leahy Scale: Fair     Standing balance support: Bilateral upper extremity supported;During functional activity Standing balance-Leahy Scale: Zero Standing balance comment: immediate LOB with bil UE release to wash hands                            ADL Overall ADL's : Needs assistance/impaired Eating/Feeding: Set up;Bed level Eating/Feeding Details (indicate cue type and reason): pt with decr grasp on utensils and could benefit from built up utensils.  Grooming: Wash/dry hands;Maximal assistance;Standing Grooming Details (indicate cue type and reason): pt overshooting for handle , no righting reaction, LOB x4, resting head against paper towel holder for balance to release with hands                 Toilet Transfer: Moderate assistance;RW           Functional mobility during ADLs: Moderate assistance;Rolling walker General ADL Comments: Pt with progressive weakness with mobilty and required incr assistance as session progressed. Pt with (A) to remain upright. Pt with progressive hip flexion and anterior LOB. Pt with no right reaction or ankle strategies to correct error.     Vision Additional Comments: pt had glasses at hospital and they have been lost since admission. Daughter requesting  more information on how the hospital plans to resolve this problem   Perception     Praxis      Pertinent Vitals/Pain Pain Assessment: Faces Faces Pain Scale: Hurts little more Pain Location: BIL FEET Pain Descriptors / Indicators: Tingling Pain Intervention(s): Repositioned     Hand Dominance Right   Extremity/Trunk Assessment Upper Extremity Assessment Upper  Extremity Assessment: Generalized weakness   Lower Extremity Assessment Lower Extremity Assessment: Generalized weakness;Defer to PT evaluation   Cervical / Trunk Assessment Cervical / Trunk Assessment: Kyphotic   Communication Communication Communication: HOH   Cognition Arousal/Alertness: Awake/alert Behavior During Therapy: Flat affect;Impulsive Overall Cognitive Status: Impaired/Different from baseline Area of Impairment: Attention;Memory;Following commands;Safety/judgement;Awareness;Problem solving Orientation Level: Disoriented to;Time Current Attention Level: Sustained Memory: Decreased recall of precautions;Decreased short-term memory Following Commands: Follows one step commands with increased time Safety/Judgement: Decreased awareness of safety;Decreased awareness of deficits Awareness: Intellectual Problem Solving: Slow processing;Difficulty sequencing General Comments: Pt reports this month as "january" "its the first month" pt awareness to fall risk. Pt reports no pain but when daughter ask "how do your feet feel" Pt reports "oh they hurt" Pt with LOB in bathroom with no right reaction or awarness to LOB   General Comments       Exercises       Shoulder Instructions      Home Living Family/patient expects to be discharged to:: Private residence Living Arrangements: Alone Available Help at Discharge: Family;Available PRN/intermittently Type of Home: House Home Access: Level entry     Home Layout: One level     Bathroom Shower/Tub: Teacher, early years/pre: Standard     Home Equipment: Cane - single point;Shower seat;Adaptive equipment;Transport chair;Bedside commode;Walker - 2 wheels;Hospital bed Adaptive Equipment: Reacher Additional Comments: daughter will hire RN staff to (A) but will be home alone for few hours at a time      Prior Functioning/Environment Level of Independence: Needs assistance  Gait / Transfers Assistance Needed: RW  with several falls since last admission ADL's / Homemaking Assistance Needed: requires (A) due to fall risk        OT Diagnosis: Acute pain;Cognitive deficits;Generalized weakness   OT Problem List: Decreased strength;Decreased activity tolerance;Impaired balance (sitting and/or standing);Impaired vision/perception;Decreased cognition;Decreased safety awareness;Decreased knowledge of use of DME or AE;Decreased knowledge of precautions;Pain   OT Treatment/Interventions: Self-care/ADL training;Therapeutic exercise;DME and/or AE instruction;Therapeutic activities;Patient/family education;Balance training;Cognitive remediation/compensation    OT Goals(Current goals can be found in the care plan section) Acute Rehab OT Goals Patient Stated Goal: to go home OT Goal Formulation: With patient/family Time For Goal Achievement: 01/16/15 Potential to Achieve Goals: Fair  OT Frequency: Min 2X/week   Barriers to D/C:            Co-evaluation              End of Session Equipment Utilized During Treatment: Gait belt;Rolling walker Nurse Communication: Mobility status;Precautions  Activity Tolerance: Patient limited by fatigue Patient left: in bed;with call bell/phone within reach;with family/visitor present   Time: 0806-0832 OT Time Calculation (min): 26 min Charges:  OT General Charges $OT Visit: 1 Procedure OT Evaluation $Initial OT Evaluation Tier I: 1 Procedure OT Treatments $Self Care/Home Management : 8-22 mins G-Codes:    Parke Poisson B Jan 22, 2015, 8:57 AM   Jeri Modena   OTR/L Pager: [phone] Office: [phone] .

## 2015-01-03 DIAGNOSIS — A419 Sepsis, unspecified organism: Secondary | ICD-10-CM

## 2015-01-03 DIAGNOSIS — E119 Type 2 diabetes mellitus without complications: Secondary | ICD-10-CM

## 2015-01-03 DIAGNOSIS — Z7189 Other specified counseling: Secondary | ICD-10-CM | POA: Insufficient documentation

## 2015-01-03 DIAGNOSIS — E871 Hypo-osmolality and hyponatremia: Secondary | ICD-10-CM

## 2015-01-03 DIAGNOSIS — J189 Pneumonia, unspecified organism: Secondary | ICD-10-CM

## 2015-01-03 DIAGNOSIS — Z515 Encounter for palliative care: Secondary | ICD-10-CM | POA: Insufficient documentation

## 2015-01-03 LAB — CBC
HCT: 26.4 % — ABNORMAL LOW (ref 39.0–52.0)
Hemoglobin: 8.5 g/dL — ABNORMAL LOW (ref 13.0–17.0)
MCH: 23.8 pg — AB (ref 26.0–34.0)
MCHC: 32.2 g/dL (ref 30.0–36.0)
MCV: 73.9 fL — ABNORMAL LOW (ref 78.0–100.0)
PLATELETS: 439 10*3/uL — AB (ref 150–400)
RBC: 3.57 MIL/uL — ABNORMAL LOW (ref 4.22–5.81)
RDW: 18.9 % — AB (ref 11.5–15.5)
WBC: 28.4 10*3/uL — ABNORMAL HIGH (ref 4.0–10.5)

## 2015-01-03 LAB — BASIC METABOLIC PANEL
Anion gap: 10 (ref 5–15)
BUN: 23 mg/dL — ABNORMAL HIGH (ref 6–20)
CALCIUM: 8.4 mg/dL — AB (ref 8.9–10.3)
CHLORIDE: 91 mmol/L — AB (ref 101–111)
CO2: 26 mmol/L (ref 22–32)
CREATININE: 0.61 mg/dL (ref 0.61–1.24)
GFR calc Af Amer: >60 mL/min (ref >60)
Glucose, Bld: 38 mg/dL — CL (ref 65–99)
Potassium: 5.1 mmol/L (ref 3.5–5.1)
SODIUM: 127 mmol/L — AB (ref 135–145)

## 2015-01-03 LAB — GLUCOSE, CAPILLARY
GLUCOSE-CAPILLARY: 328 mg/dL — AB (ref 65–99)
Glucose-Capillary: 72 mg/dL (ref 65–99)
Glucose-Capillary: 192 mg/dL — ABNORMAL HIGH (ref 65–99)
Glucose-Capillary: 289 mg/dL — ABNORMAL HIGH (ref 65–99)

## 2015-01-03 LAB — LACTIC ACID, PLASMA
LACTIC ACID, VENOUS: 2.1 mmol/L — AB (ref 0.5–2.0)
Lactic Acid, Venous: 2.3 mmol/L (ref 0.5–2.0)

## 2015-01-03 LAB — PROTIME-INR
INR: 1.47 (ref 0.00–1.49)
PROTHROMBIN TIME: 17.9 s — AB (ref 11.6–15.2)

## 2015-01-03 LAB — PROCALCITONIN: PROCALCITONIN: 1.41 ng/mL

## 2015-01-03 LAB — APTT: aPTT: 35 seconds (ref 24–37)

## 2015-01-03 MED ORDER — VANCOMYCIN HCL 10 G IV SOLR
1250.0000 mg | Freq: Once | INTRAVENOUS | Status: DC
  Filled 2015-01-03 (×2): qty 1250

## 2015-01-03 MED ORDER — VANCOMYCIN HCL IN DEXTROSE 1-5 GM/200ML-% IV SOLN
1000.0000 mg | Freq: Two times a day (BID) | INTRAVENOUS | Status: DC
  Filled 2015-01-03: qty 200

## 2015-01-03 MED ORDER — SODIUM CHLORIDE 0.9 % IV SOLN: INTRAVENOUS | Status: DC

## 2015-01-03 MED ORDER — SACCHAROMYCES BOULARDII 250 MG PO CAPS
250.0000 mg | ORAL_CAPSULE | Freq: Two times a day (BID) | ORAL | Status: DC
  Administered 2015-01-03 – 2015-01-08 (×11): 250 mg via ORAL
  Filled 2015-01-03 (×11): qty 1

## 2015-01-03 MED ORDER — DEXTROSE 5 % IV SOLN
2.0000 g | Freq: Three times a day (TID) | INTRAVENOUS | Status: DC
  Administered 2015-01-03 – 2015-01-05 (×5): 2 g via INTRAVENOUS
  Filled 2015-01-03 (×7): qty 2

## 2015-01-03 MED ORDER — SODIUM CHLORIDE 0.9 % IV BOLUS (SEPSIS): 500.0000 mL | INTRAVENOUS | Status: AC

## 2015-01-03 MED ORDER — METRONIDAZOLE IN NACL 5-0.79 MG/ML-% IV SOLN
500.0000 mg | Freq: Three times a day (TID) | INTRAVENOUS | Status: DC
  Administered 2015-01-03 – 2015-01-05 (×7): 500 mg via INTRAVENOUS
  Filled 2015-01-03 (×8): qty 100

## 2015-01-03 MED ORDER — SODIUM CHLORIDE 0.9 % IV BOLUS (SEPSIS)
1000.0000 mL | INTRAVENOUS | Status: AC
  Administered 2015-01-03 (×2): 1000 mL via INTRAVENOUS

## 2015-01-03 MED ORDER — VANCOMYCIN HCL IN DEXTROSE 1-5 GM/200ML-% IV SOLN
1000.0000 mg | Freq: Three times a day (TID) | INTRAVENOUS | Status: DC
  Administered 2015-01-03 – 2015-01-05 (×5): 1000 mg via INTRAVENOUS
  Filled 2015-01-03 (×6): qty 200

## 2015-01-03 MED ORDER — INSULIN GLARGINE 100 UNIT/ML ~~LOC~~ SOLN
10.0000 [IU] | Freq: Every day | SUBCUTANEOUS | Status: DC
  Filled 2015-01-03 (×2): qty 0.1

## 2015-01-03 MED ORDER — CEFTAZIDIME 1 G IJ SOLR
1.0000 g | Freq: Three times a day (TID) | INTRAMUSCULAR | Status: DC
  Filled 2015-01-03 (×3): qty 1

## 2015-01-03 NOTE — Care Management Note (Signed)
Case Management Note  Patient Details  Name: KITT LEDET MRN: [id number] Date of Birth: Sep 05, 1945  Subjective/Objective:                    Action/Plan: UR completed. Await palliative medicine consult.  Expected Discharge Date:                  Expected Discharge Plan:     In-House Referral:     Discharge planning Services     Post Acute Care Choice:    Choice offered to:     DME Arranged:    DME Agency:     HH Arranged:    HH Agency:     Status of Service:  In process, will continue to follow  Medicare Important Message Given:    Date Medicare IM Given:    Medicare IM give by:    Date Additional Medicare IM Given:    Additional Medicare Important Message give by:     If discussed at [place] of Stay Meetings, dates discussed:    Additional Comments:  Marilu Favre, RN 01/03/2015, 7:47 AM

## 2015-01-03 NOTE — Progress Notes (Signed)
CRITICAL VALUE ALERT  Critical value received:  Lactic Acid 2.3   Date of notification:  01/03/15  Time of notification:  1230  Critical value read back: yes  Nurse who received alert: Morton Peters  MD notified (1st page):  Dr Janece Canterbury  Time of first page:  1430 MD notified (2nd page):  Time of second page:  Responding MD:   Time MD responded:
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
## 2015-01-04 ENCOUNTER — Encounter (HOSPITAL_COMMUNITY): Payer: Self-pay | Admitting: General Practice

## 2015-01-04 LAB — BASIC METABOLIC PANEL
ANION GAP: 10 (ref 5–15)
BUN: 15 mg/dL (ref 6–20)
CHLORIDE: 90 mmol/L — AB (ref 101–111)
CO2: 24 mmol/L (ref 22–32)
Calcium: 8.3 mg/dL — ABNORMAL LOW (ref 8.9–10.3)
Creatinine, Ser: 0.53 mg/dL — ABNORMAL LOW (ref 0.61–1.24)
GFR calc non Af Amer: >60 mL/min (ref >60)
GLUCOSE: 240 mg/dL — AB (ref 65–99)
Potassium: 4.8 mmol/L (ref 3.5–5.1)
Sodium: 124 mmol/L — ABNORMAL LOW (ref 135–145)

## 2015-01-04 LAB — CBC
HCT: 23.5 % — ABNORMAL LOW (ref 39.0–52.0)
HEMOGLOBIN: 7.3 g/dL — AB (ref 13.0–17.0)
MCH: 23 pg — AB (ref 26.0–34.0)
MCHC: 31.1 g/dL (ref 30.0–36.0)
MCV: 74.1 fL — AB (ref 78.0–100.0)
Platelets: 368 10*3/uL (ref 150–400)
RBC: 3.17 MIL/uL — AB (ref 4.22–5.81)
RDW: 18.8 % — ABNORMAL HIGH (ref 11.5–15.5)
WBC: 25 10*3/uL — ABNORMAL HIGH (ref 4.0–10.5)

## 2015-01-04 LAB — ABO/RH: ABO/RH(D): O POS

## 2015-01-04 LAB — GLUCOSE, CAPILLARY
GLUCOSE-CAPILLARY: 325 mg/dL — AB (ref 65–99)
Glucose-Capillary: 215 mg/dL — ABNORMAL HIGH (ref 65–99)
Glucose-Capillary: 299 mg/dL — ABNORMAL HIGH (ref 65–99)
Glucose-Capillary: 304 mg/dL — ABNORMAL HIGH (ref 65–99)

## 2015-01-04 LAB — PREPARE RBC (CROSSMATCH)

## 2015-01-04 MED ORDER — GLUCERNA SHAKE PO LIQD
237.0000 mL | Freq: Three times a day (TID) | ORAL | Status: DC
  Administered 2015-01-05 – 2015-01-09 (×12): 237 mL via ORAL

## 2015-01-04 MED ORDER — SODIUM CHLORIDE 0.9 % IV SOLN
Freq: Once | INTRAVENOUS | Status: AC
  Administered 2015-01-04: 13:00:00 via INTRAVENOUS

## 2015-01-04 MED ORDER — INSULIN GLARGINE 100 UNIT/ML ~~LOC~~ SOLN
20.0000 [IU] | Freq: Two times a day (BID) | SUBCUTANEOUS | Status: DC
  Administered 2015-01-04 – 2015-01-05 (×3): 20 [IU] via SUBCUTANEOUS
  Filled 2015-01-04 (×5): qty 0.2

## 2015-01-04 MED ORDER — WHITE PETROLATUM GEL
Status: AC
  Filled 2015-01-04: qty 1

## 2015-01-04 MED ORDER — FLUCONAZOLE 100 MG PO TABS
200.0000 mg | ORAL_TABLET | Freq: Every day | ORAL | Status: DC
  Administered 2015-01-04 – 2015-01-09 (×6): 200 mg via ORAL
  Filled 2015-01-04 (×6): qty 2

## 2015-01-04 MED ORDER — WHITE PETROLATUM GEL: Status: DC | PRN

## 2015-01-04 MED ORDER — ENSURE ENLIVE PO LIQD
237.0000 mL | Freq: Two times a day (BID) | ORAL | Status: DC
  Administered 2015-01-07: 237 mL via ORAL

## 2015-01-04 NOTE — Progress Notes (Signed)
Daily Progress Note   Patient Name: Frank Krueger       Date: 01/04/2015 DOB: 11-04-1945  Age: 69 y.o. MRN#: [id number] Attending Physician: Janece Canterbury, MD Primary Care Physician: No primary care provider on file. Admit Date: 01/02/2015  Reason for Consultation/Follow-up: Establishing goals of care and Psychosocial/spiritual support  Subjective: Frank Krueger is sitting up in bed today.  He looks more alert today.  He tells his goal is to "Go home".  He asks me if he is going to die and I pause and tell him that we are all going to die.  I then ask if he means will he survive this hospitalization, then I tell him that I think he will.   He states in front of his daughter that he does not want to come back to the hospital.  I ask even if this means his death and he says yes, even if this means he will die.   I talk with [name] who is able to stay in the hospital most all day, working remotely.  She talks with me about the scheduled blood transfusion, and we talk about his small decrease in WBC. We talk about Frank Krueger saying he wants to go home and not come back, and while [name] agrees with the goal of going home ( she feels he will do better), she states she can not, at this point, say that he will not come back to the hospital for an infection such as a UTI.  I share with her that although he may come back for something simple, he still may worsen and not be able to leave. [name] does agree to have a Hospice consult at Frank Krueger' home.  She states if they can only come for one hour per day then she does not want to do this. We talk about Hospice services increasing as time of death gets closer.  I encourage her to hear from them what they will and will not do.  We discuss the likelihood that hospice will not support chemo, but maybe palliative radiation.     [name] states she also wants to see about [facility], as [facility],OT,ST, and nursing.  I encourage her to ask if they also  provide hospice services.   We talk about code status and [name] states that while her father is "having full conversations"... " I cant give up".    She tells me that they have not seen a notary for the durable and HCPOA, I call chaplain services who tells me that they will send a notary.     Length of Stay: 2 days  Current Medications: Scheduled Meds:  . sodium chloride   Intravenous Once  . atorvastatin  40 mg Oral q1800  . cefTAZidime (FORTAZ)  IV  2 g Intravenous 3 times per day  . clopidogrel  75 mg Oral Daily  . dexamethasone  4 mg Oral QID  . enoxaparin (LOVENOX) injection  40 mg Subcutaneous Q24H  . feeding supplement (GLUCERNA SHAKE)  237 mL Oral TID BM  . gabapentin  300 mg Oral BID  . insulin aspart  0-20 Units Subcutaneous TID WC  . insulin aspart  0-5 Units Subcutaneous QHS  . insulin glargine  20 Units Subcutaneous BID  . levETIRAcetam  1,500 mg Oral BID  . magnesium chloride  1 tablet Oral BID  . metronidazole  500 mg Intravenous Q8H  . pantoprazole  40 mg Oral Daily  . saccharomyces  boulardii  250 mg Oral BID  . sodium chloride  1 g Oral BID WC  . vancomycin  1,250 mg Intravenous Once  . vancomycin  1,000 mg Intravenous Q8H  . white petrolatum        Continuous Infusions:    PRN Meds: acetaminophen **OR** acetaminophen, alum & mag hydroxide-simeth, ondansetron **OR** ondansetron (ZOFRAN) IV, senna-docusate, sodium chloride, white petrolatum  Palliative Performance Scale: 30%     Vital Signs: BP 132/64 mmHg  Pulse 56  Temp(Src) 97.9 F (36.6 C) (Oral)  Resp 20  SpO2 94% SpO2: SpO2: 94 % O2 Device: O2 Device: Not Delivered O2 Flow Rate:    Intake/output summary:  Intake/Output Summary (Last 24 hours) at 01/04/15 1151 Last data filed at 01/04/15 2706  Gross per 24 hour  Intake    240 ml  Output   2450 ml  Net  -2210 ml   LBM:   Baseline Weight:   Most recent weight:    Physical Exam: Constitutional: Looks older than stated age, frail,  temporal wasting, dry cracked lips.  Resp: Even and non labored.  GI: abd distended, firm, mild tenderness.  Psych: Sleepy, slight confusion, but more alert than yesterday.              Additional Data Reviewed: Recent Labs     01/03/15  0525  01/04/15  0512  WBC  28.4*  25.0*  HGB  8.5*  7.3*  PLT  439*  368  NA  127*  124*  BUN  23*  15  CREATININE  0.61  0.53*     Problem List:  Patient Active Problem List   Diagnosis Date Noted  . Postobstructive pneumonia 01/03/2015  . Sepsis 01/03/2015  . Palliative care encounter   . DNR (do not resuscitate) discussion   . Orthostasis 01/02/2015  . UTI (lower urinary tract infection)   . Diarrhea   . Metastatic cancer to brain   . Pulmonary hypertension   . Blood poisoning   . Seizure disorder   . Non-small cell lung cancer   . Brain metastases   . Diabetes type 2, uncontrolled   . Hypomagnesemia   . Debility   . Encounter for palliative care   . DM2 (diabetes mellitus, type 2) 12/21/2014  . Status epilepticus   . Acute respiratory failure with hypoxemia   . Hypertension 12/15/2014  . Hyponatremia 12/15/2014  . COPD (chronic obstructive pulmonary disease) 12/15/2014  . Leukocytosis 12/15/2014  . Anemia 12/15/2014  . Hyperglycemia 12/15/2014  . Liver metastases 12/07/2014  . 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 Bone metastasis 12/07/2014  . Primary cancer of right lower lobe of lung 12/07/2014  . Metastasis to brain 11/28/2014     Palliative Care Assessment & Plan    Code Status:  Full code   Daughter states that she has discussed the realities of CPR, but Frank Krueger continues to want this.  We discuss his desires to have her help make decisions for him.  She relates to me that since he is having full conversations, "I can't give up".    Goals of Care:  Frank Krueger states his goal is to "Go home".  He asks me if he is going to die and I pause and tell him that we are all going to die.  I then  ask if he means will he survive this hospitalization, then I tell him that I think he will.   He states in front of his daughter  that he does not want to come back to the hospital.  I ask even if this means his death and he says yes, even if this means he will die.    Symptom Management:  Tylenol 650 mg PO/ PR Q 6 hours PRN.   Zofran 4 mg PO/IV Q 6 hours PRN.   Palliative Prophylaxis:  Senna-S 1 tab Q HS.  Psycho-social/Spiritual:  Desire for further Chaplaincy support:  Not discussed today.    Prognosis: < 6 months Discharge Planning: Home with [place] with [facility], vs Home with Hospice.    Care plan was discussed with [place] discussion shared with nursing staff and Dr. Sheran Fava.   Thank you for allowing the Palliative Medicine Team to assist in the care of this patient.   Time In: 1130 Time Out: 1210 Total Time 40 minutes Prolonged Time Billed  no     Greater than 50%  of this time was spent counseling and coordinating care related to the above assessment and plan.  Drue Novel, NP  01/04/2015, 11:51 AM  Please contact Palliative Medicine Team phone at [phone] for questions and concerns.

## 2015-01-04 NOTE — Progress Notes (Signed)
[facility] PROGRESS NOTE  Frank Krueger:[id number] DOB: 08/01/1945 DOA: 01/02/2015 PCP: No primary care provider on file.  Brief Summary  Frank Krueger is a 69 y.o. Male with a history of COPD, high cholesterol, hypertension, lung cancer with metastasis to brain status post radiation therapy and on decadron, hyponatremia with SIADH, diabetes type 2. Patient has been hospitalized essentially from 8/19 until this morning for sepsis, SIADH, hyponatremia. The patient was finally released and was taken via ambulance to the [facility] clinic in [place] in order to receive his medications. The patient's sodium level had been stable at approximately 125 and the patient continued to have some intermittent episodes of confusion.  At the [facility] clinic, the patient was seen by the [facility] physician, who refused to write his prescriptions and directed the patient back to the hospital to be under the care of medical oncology.  The patient's oncologist is through the [place] and he has received special permission to be otherwise treated at [facility].  On reevaluation in the emergency apartment, the patient's blood sugar was 371, the patient was orthostatic, and more confused then this morning when the patient was discharged. The daughter notes no palliating or provoking factors to the confusion, but notes that this is intermittent in nature and likely related to his brain metastases for which he recently completed XRT.    Assessment/Plan  Sepsis likely secondary to post-obstructive pneumonia/HCAP, fever to 102.59F, tachycardia, tachypnea -  Sepsis protocol ordered with IVF -  F/u blood cultures -  Continue vancomycin, ceftaz and flagyl -  procalcitonin elevatd -  Lactic acid trending up despite IVF  Candidal UTI -  Fluconazole $RemoveBefo'200mg'GEXWKLPsQgF$  daily x 14 days, last day on 9/21. -  Monitor carefully for seizures.  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   Sodium trending down with IVF for his sepsis.   -  Repeat sodium in AM -  Continue sodium tabs and free water restriction -  D/c IVF  Lung cancer metastatic to the brain -  Just completed brain radiation by Dr. Lisbeth Renshaw -  Continue decadron -  Followed by medical oncology through the [facility], however, he is not a good candidate for chemotherapy at this time secondary to his weight loss, cachexia, progressive weakness, and current sepsis -  Patient understands that he is not a candidate for surgery or chemotherapy at this time, but he "wants to live" and wants [place] to stop the progression of cancer.  I explained that radiation, which is currently the only tool left in our arsenal for him at this time, will not stop his cancer.  He and his daughter met with palliative care and are in the process of deliberating about his prognosis and GOC, including the possibility of DNR and hospice care.    Diabetes type 2, hypoglycemic this morning likely secondary to sepsis -  Increase to lantus 10 units -  D/c standing meal time insulin -  Continue high dose SSI  Essential hypertension with borderline hypotension this morning -  Hold his BP medications except for metoprolol for now and start resuming if blood pressure rises  Sinus tachycardia likely related to sepsis and malignancy -  Trend after IV resuscitation -  TSH wnl -  D-dimer likely to be elevated in setting of infection and he appears to have pneumonia on CXR as a more likely explanation for his tachycardia than PE at this time  Microcytic anemia,  occult negative and TSH wnl. -  Iron studies, B12, folate  Leukocytosis, likely due to malignancy, steroids, and possible sepsis (rose from yesterday to today)   Diet:  Dysphagia 3 with thin liquid Access:  PIV IVF:  yes Proph:  lovenox  Code Status:  DNR after conversation with patient and daughter Family Communication: patient and his daughter Disposition Plan:  Pending improvement in fever, on  oral antibiotics to home with home health services but with phone number for hospice to call when he becomes more ill.  He does not want to be rehospitalized but would like acute problems to be addressed by his oncologist until he is set up with hospice care.   Consultants:  Palliative care  Procedures:  Swallow eval  CXR  Antibiotics:  Vancomycin, ceftaz, and flagyl on 9/7 >   HPI/Subjective:  States that he would like to go home and not have to go back to teh hospital.  He denies cough, SOB, nausea, vomiting, diarrhea, abdominal pain, dysuria. He is confused.   Objective: Filed Vitals:   01/04/15 0625 01/04/15 1135 01/04/15 1157 01/04/15 1213  BP: 123/57 132/64 127/60 132/60  Pulse: 76 56    Temp: 97.5 F (36.4 C) 97.9 F (36.6 C) 97.9 F (36.6 C) 97.9 F (36.6 C)  TempSrc: Oral Oral Oral Oral  Resp: $Remo'20  18 18  'cugmM$ SpO2: 94%  91% 91%    Intake/Output Summary (Last 24 hours) at 01/04/15 1402 Last data filed at 01/04/15 1130  Gross per 24 hour  Intake    480 ml  Output   2450 ml  Net  -1970 ml   There were no vitals filed for this visit. There is no weight on file to calculate BMI.  Exam:   General:  Adult male, No acute distress, sitting in chair by window, tired appearing, cachectic  HEENT:  NCAT, MMM  Cardiovascular:  RRR, nl S1, S2 no mrg, 2+ pulses, warm extremities  Respiratory:  Diminished at the right base without obvious rales or rhonchi or wheeze, no increased WOB  Abdomen:   NABS, soft, NT/ND  MSK:   Normal tone and bulk, no LEE  Neuro:  Diffusely weak  Data Reviewed: Basic Metabolic Panel:  Recent Labs Lab 12/31/14 0709 01/01/15 0348 01/02/15 0500 01/02/15 1407 01/03/15 0525 01/04/15 0512  NA 123* 125* 126* 120* 127* 124*  K 4.5 4.8 4.8 4.6 5.1 4.8  CL 87* 88* 89* 85* 91* 90*  CO2 $Re'26 27 28 25 26 24  'MUC$ GLUCOSE 163* 219* 165* 371* 38* 240*  BUN $Re'17 20 17 20 'pMZ$ 23* 15  CREATININE 0.54* 0.63 0.57* 0.64 0.61 0.53*  CALCIUM 8.7* 8.4* 8.6*  8.6* 8.4* 8.3*  MG 1.4* 1.6* 1.6*  --   --   --    Liver Function Tests:  Recent Labs Lab 01/01/15 0348 01/02/15 0500  AST 42* 50*  ALT 54 65*  ALKPHOS 207* 226*  BILITOT 0.4 0.3  PROT 5.8* 5.7*  ALBUMIN 1.2* 1.2*   No results for input(s): LIPASE, AMYLASE in the last 168 hours. No results for input(s): AMMONIA in the last 168 hours. CBC:  Recent Labs Lab 01/01/15 0348 01/02/15 0500 01/02/15 1407 01/03/15 0525 01/04/15 0512  WBC 27.1* 26.6* 23.9* 28.4* 25.0*  NEUTROABS 24.6*  --   --   --   --   HGB 8.7* 8.7* 8.8* 8.5* 7.3*  HCT 26.9* 26.3* 27.4* 26.4* 23.5*  MCV 73.9* 73.7* 73.7* 73.9* 74.1*  PLT 473* 429* 462* 439*  368    Recent Results (from the past 240 hour(s))  Culture, Urine     Status: None   Collection Time: 12/27/14  4:59 PM  Result Value Ref Range Status   Specimen Description URINE, CLEAN CATCH  Final   Special Requests NONE  Final   Culture >=100,000 COLONIES/mL YEAST  Final   Report Status 12/29/2014 FINAL  Final  Culture, blood (routine x 2)     Status: None (Preliminary result)   Collection Time: 01/02/15 10:29 PM  Result Value Ref Range Status   Specimen Description BLOOD RIGHT ANTECUBITAL  Final   Special Requests BOTTLES DRAWN AEROBIC AND ANAEROBIC 5CC  Final   Culture NO GROWTH 2 DAYS  Final   Report Status PENDING  Incomplete  Culture, blood (routine x 2)     Status: None (Preliminary result)   Collection Time: 01/02/15 10:32 PM  Result Value Ref Range Status   Specimen Description BLOOD LEFT ANTECUBITAL  Final   Special Requests BOTTLES DRAWN AEROBIC AND ANAEROBIC 5CC  Final   Culture NO GROWTH 2 DAYS  Final   Report Status PENDING  Incomplete     Studies: Dg Chest Port 1 View  01/02/2015   CLINICAL DATA:  Fever.  No chest pain or shortness of breath.  EXAM: PORTABLE CHEST - 1 VIEW  COMPARISON:  12/21/2014  FINDINGS: Shallow inspiration. Normal heart size and pulmonary vascularity. Right hilar mass lesion is again demonstrated. There  appears to be slightly increased infiltration lateral to the mass which may indicate postobstructive change. Left lung is grossly clear. No blunting of costophrenic angles. No pneumothorax. Right central venous catheter with tip over the mid SVC region. Normal heart size and pulmonary vascularity. Tortuous aorta.  IMPRESSION: Right hilar mass again demonstrated as seen previously. There appears to be increase of infiltration lateral to the mass which may indicate postobstructive change.   Electronically Signed   By: Lucienne Capers M.D.   On: 01/02/2015 22:27    Scheduled Meds: . atorvastatin  40 mg Oral q1800  . cefTAZidime (FORTAZ)  IV  2 g Intravenous 3 times per day  . clopidogrel  75 mg Oral Daily  . dexamethasone  4 mg Oral QID  . enoxaparin (LOVENOX) injection  40 mg Subcutaneous Q24H  . feeding supplement (GLUCERNA SHAKE)  237 mL Oral TID BM  . gabapentin  300 mg Oral BID  . insulin aspart  0-20 Units Subcutaneous TID WC  . insulin aspart  0-5 Units Subcutaneous QHS  . insulin glargine  20 Units Subcutaneous BID  . levETIRAcetam  1,500 mg Oral BID  . magnesium chloride  1 tablet Oral BID  . metronidazole  500 mg Intravenous Q8H  . pantoprazole  40 mg Oral Daily  . saccharomyces boulardii  250 mg Oral BID  . sodium chloride  1 g Oral BID WC  . vancomycin  1,250 mg Intravenous Once  . vancomycin  1,000 mg Intravenous Q8H  . white petrolatum       Continuous Infusions:    Active Problems:   Hyponatremia   DM2 (diabetes mellitus, type 2)   Metastatic cancer to brain   Orthostasis   Postobstructive pneumonia   Sepsis   Palliative care encounter   DNR (do not resuscitate) discussion    Time spent: 30 min    Tanji Storrs, [facility] Pager [phone]. If 7PM-7AM, please contact night-coverage at [email or web address], password [facility] 01/04/2015, 2:02 PM  LOS: 2 days

## 2015-01-05 LAB — BASIC METABOLIC PANEL
ANION GAP: 9 (ref 5–15)
BUN: 14 mg/dL (ref 6–20)
CALCIUM: 8.3 mg/dL — AB (ref 8.9–10.3)
CO2: 25 mmol/L (ref 22–32)
Chloride: 89 mmol/L — ABNORMAL LOW (ref 101–111)
Creatinine, Ser: 0.58 mg/dL — ABNORMAL LOW (ref 0.61–1.24)
GFR calc Af Amer: >60 mL/min (ref >60)
GFR calc non Af Amer: >60 mL/min (ref >60)
GLUCOSE: 279 mg/dL — AB (ref 65–99)
POTASSIUM: 4.6 mmol/L (ref 3.5–5.1)
Sodium: 123 mmol/L — ABNORMAL LOW (ref 135–145)

## 2015-01-05 LAB — GLUCOSE, CAPILLARY
Glucose-Capillary: 297 mg/dL — ABNORMAL HIGH (ref 65–99)
Glucose-Capillary: 269 mg/dL — ABNORMAL HIGH (ref 65–99)
Glucose-Capillary: 394 mg/dL — ABNORMAL HIGH (ref 65–99)
Glucose-Capillary: 264 mg/dL — ABNORMAL HIGH (ref 65–99)

## 2015-01-05 LAB — CBC
HCT: 25.8 % — ABNORMAL LOW (ref 39.0–52.0)
Hemoglobin: 8.6 g/dL — ABNORMAL LOW (ref 13.0–17.0)
MCH: 24.6 pg — ABNORMAL LOW (ref 26.0–34.0)
MCHC: 33.3 g/dL (ref 30.0–36.0)
MCV: 73.9 fL — ABNORMAL LOW (ref 78.0–100.0)
Platelets: 331 10*3/uL (ref 150–400)
RBC: 3.49 MIL/uL — ABNORMAL LOW (ref 4.22–5.81)
RDW: 18.4 % — ABNORMAL HIGH (ref 11.5–15.5)
WBC: 25.1 10*3/uL — ABNORMAL HIGH (ref 4.0–10.5)

## 2015-01-05 LAB — TYPE AND SCREEN
ABO/RH(D): O POS
Antibody Screen: NEGATIVE
UNIT DIVISION: 0

## 2015-01-05 MED ORDER — DEXTROSE 5 % IV SOLN
1.0000 g | INTRAVENOUS | Status: DC
  Administered 2015-01-05 – 2015-01-06 (×2): 1 g via INTRAVENOUS
  Filled 2015-01-05 (×3): qty 10

## 2015-01-05 MED ORDER — DICLOFENAC SODIUM 1 % TD GEL
4.0000 g | Freq: Four times a day (QID) | TRANSDERMAL | Status: DC
  Administered 2015-01-05 – 2015-01-08 (×14): 4 g via TOPICAL
  Filled 2015-01-05: qty 100

## 2015-01-05 MED ORDER — INSULIN GLARGINE 100 UNIT/ML ~~LOC~~ SOLN
30.0000 [IU] | Freq: Two times a day (BID) | SUBCUTANEOUS | Status: DC
  Administered 2015-01-05 – 2015-01-06 (×3): 30 [IU] via SUBCUTANEOUS
  Filled 2015-01-05 (×4): qty 0.3

## 2015-01-05 MED ORDER — INSULIN ASPART 100 UNIT/ML ~~LOC~~ SOLN
5.0000 [IU] | Freq: Three times a day (TID) | SUBCUTANEOUS | Status: DC
  Administered 2015-01-05 – 2015-01-06 (×2): 5 [IU] via SUBCUTANEOUS

## 2015-01-05 MED ORDER — POLYVINYL ALCOHOL 1.4 % OP SOLN
2.0000 [drp] | OPHTHALMIC | Status: DC | PRN
  Administered 2015-01-06: 2 [drp] via OPHTHALMIC
  Filled 2015-01-05: qty 15

## 2015-01-05 MED ORDER — METRONIDAZOLE 500 MG PO TABS
500.0000 mg | ORAL_TABLET | Freq: Three times a day (TID) | ORAL | Status: DC
  Administered 2015-01-05 – 2015-01-07 (×6): 500 mg via ORAL
  Filled 2015-01-05 (×6): qty 1

## 2015-01-05 MED ORDER — GABAPENTIN 300 MG PO CAPS
300.0000 mg | ORAL_CAPSULE | Freq: Three times a day (TID) | ORAL | Status: DC
  Administered 2015-01-05 – 2015-01-08 (×10): 300 mg via ORAL
  Filled 2015-01-05 (×10): qty 1

## 2015-01-05 NOTE — Progress Notes (Signed)
   01/05/15 1600  Clinical Encounter Type  Visited With Patient and family together  Visit Type Initial;Other (Comment)  Referral From Nurse (Advance Directives)  Spiritual Encounters  Spiritual Needs Prayer;Emotional  Stress Factors  Patient Stress Factors Health changes  Family Stress Factors Health changes;Other (Comment) (Wanting to take care of her dad health)  Advance Directives (For Healthcare)  Does patient have an advance directive? Yes  Type of Par[employer] of [place];Living will  Copy of advanced directive(s) in chart? Yes  Chaplain spoke with daughter and patient about advance directives; Chaplain got notary and witnesses to finalize the document; Chaplain prayed with and for patient and his daughter

## 2015-01-05 NOTE — Clinical Social Work Note (Addendum)
Chaplain resident requested assistance notarizing advanced directive for patient. Unfortunately chart reflects that the patient is confused/disoriented at this time. CSW will not be able to notarize a advance directive for patient until the patient is A&Ox4 and able to articulate what he is signing. Please re-consult for this service at a more appropriate time.    UPDATE: CSW met with patient and daughter. Per CSW's and chaplain resident's assessment patient is capable and competent to complete HPOA paperwork. Patient did have difficulty with writing, but was able to make a mark for his signature and initials. CSW has notarized documentation for patient. CSW signing off.  Liz Beach MSW, [place], [employer], [phone]

## 2015-01-05 NOTE — Progress Notes (Signed)
ANTIBIOTIC CONSULT NOTE - INITIAL  Pharmacy Consult for Rocephin Indication: CAP   Labs:  Recent Labs  01/03/15 0525 01/04/15 0512 01/05/15 0510  WBC 28.4* 25.0* 25.1*  HGB 8.5* 7.3* 8.6*  PLT 439* 368 331  CREATININE 0.61 0.53* 0.58*  Microbiology: Recent Results (from the past 720 hour(s))  MRSA PCR Screening     Status: None   Collection Time: 12/16/14 12:13 AM  Result Value Ref Range Status   MRSA by PCR NEGATIVE NEGATIVE Final    Comment:        The GeneXpert MRSA Assay (FDA approved for NASAL specimens only), is one component of a comprehensive MRSA colonization surveillance program. It is not intended to diagnose MRSA infection nor to guide or monitor treatment for MRSA infections.   Culture, blood (routine x 2)     Status: None   Collection Time: 12/21/14  7:21 PM  Result Value Ref Range Status   Specimen Description BLOOD LEFT ARM  Final   Special Requests BOTTLES DRAWN AEROBIC AND ANAEROBIC 5CC  Final   Culture NO GROWTH 5 DAYS  Final   Report Status 12/26/2014 FINAL  Final  Culture, blood (routine x 2)     Status: None   Collection Time: 12/21/14  7:24 PM  Result Value Ref Range Status   Specimen Description BLOOD RIGHT ARM  Final   Special Requests BOTTLES DRAWN AEROBIC AND ANAEROBIC 10CC  Final   Culture NO GROWTH 5 DAYS  Final   Report Status 12/26/2014 FINAL  Final  Urine culture     Status: None   Collection Time: 12/21/14 10:53 PM  Result Value Ref Range Status   Specimen Description URINE, CATHETERIZED  Final   Special Requests NONE  Final   Culture MULTIPLE SPECIES PRESENT, SUGGEST RECOLLECTION  Final   Report Status 12/22/2014 FINAL  Final  Culture, expectorated sputum-assessment     Status: None   Collection Time: 12/22/14  1:52 PM  Result Value Ref Range Status   Specimen Description EXPECTORATED SPUTUM  Final   Special Requests Immunocompromised  Final   Sputum evaluation   Final    THIS SPECIMEN IS ACCEPTABLE. RESPIRATORY CULTURE  REPORT TO FOLLOW.   Report Status 12/22/2014 FINAL  Final  Culture, respiratory (NON-Expectorated)     Status: None   Collection Time: 12/22/14  1:52 PM  Result Value Ref Range Status   Specimen Description SPUTUM  Final   Special Requests NONE  Final   Gram Stain   Final    FEW WBC PRESENT, PREDOMINANTLY MONONUCLEAR RARE SQUAMOUS EPITHELIAL CELLS PRESENT FEW GRAM POSITIVE COCCI IN PAIRS IN CLUSTERS RARE GRAM POSITIVE RODS RARE GRAM NEGATIVE RODS    Culture   Final    NORMAL OROPHARYNGEAL FLORA Performed at [facility]    Report Status 12/25/2014 FINAL  Final  Urine culture     Status: None   Collection Time: 12/23/14  9:55 PM  Result Value Ref Range Status   Specimen Description URINE, CLEAN CATCH  Final   Special Requests NONE  Final   Culture >=100,000 COLONIES/mL YEAST  Final   Report Status 12/25/2014 FINAL  Final  Culture, Urine     Status: None   Collection Time: 12/27/14  4:59 PM  Result Value Ref Range Status   Specimen Description URINE, CLEAN CATCH  Final   Special Requests NONE  Final   Culture >=100,000 COLONIES/mL YEAST  Final   Report Status 12/29/2014 FINAL  Final  Culture, blood (routine x  2)     Status: None (Preliminary result)   Collection Time: 01/02/15 10:29 PM  Result Value Ref Range Status   Specimen Description BLOOD RIGHT ANTECUBITAL  Final   Special Requests BOTTLES DRAWN AEROBIC AND ANAEROBIC 5CC  Final   Culture NO GROWTH 2 DAYS  Final   Report Status PENDING  Incomplete  Culture, blood (routine x 2)     Status: None (Preliminary result)   Collection Time: 01/02/15 10:32 PM  Result Value Ref Range Status   Specimen Description BLOOD LEFT ANTECUBITAL  Final   Special Requests BOTTLES DRAWN AEROBIC AND ANAEROBIC 5CC  Final   Culture NO GROWTH 2 DAYS  Final   Report Status PENDING  Incomplete    Assessment/Plan:  69yo male had been started on vanc and Fortaz, now to narrow for CAP to Rocephin.  Will start Rocephin 1g IV Q24H and  monitor CBC and Cx.  Wynona Neat, PharmD, BCPS  01/05/2015,7:44 AM

## 2015-01-05 NOTE — Clinical Social Work Note (Signed)
Per md note patient and daughter have agreed to code status change to DNR.  Gold DNR form on patient's chart awaiting signature by physician.  Jones Broom. [place], MSW, [place] 01/05/2015 10:06 AM

## 2015-01-05 NOTE — Progress Notes (Signed)
Initial Nutrition Assessment  DOCUMENTATION CODES:   Severe malnutrition in context of chronic illness  INTERVENTION:  Glucerna Shake po TID, each supplement provides 220 kcal and 10 grams of protein  Ensure Enlive po BID, each supplement provides 350 kcal and 20 grams of protein    NUTRITION DIAGNOSIS:   Malnutrition related to catabolic illness, chronic illness as evidenced by percent weight loss.    GOAL:   Patient will meet greater than or equal to 90% of their needs    MONITOR:   PO intake, Supplement acceptance, Labs, I & O's, Skin  REASON FOR ASSESSMENT:   Malnutrition Screening Tool    ASSESSMENT:   Pt with hx of Lung Cancer, Mets to Brain, S/P Radiation, COPD, HTN, Dyslipidemia, Hyponatremia, SIADH, DM2. Has essentially been hospitalized since 8/12 for Sepsis, SIADH & Hyponatremia. Pt presented to ED with a Blood Sugar of 371, Confusion, and Orthostasis. Pt presents with severe, 26#/14% wt loss iin <1 mo. NFPE appears severely malnourished in upper body, lower body is mild-to-moderate in most places. Daughter reports pt has been eating better than a couple weeks ago. Pt reports taste changes typical of oncology, as reason behind poor appetite. Currently providing Glucerna x3 and Ensure Enlive x2. Monitor supplement acceptance and PO intake.    Diet Order:  Diet Carb Modified Fluid consistency:: Thin; Room service appropriate?: Yes  Skin:  Wound (see comment) (Echymossis to lower left leg, wound to buttocks, perineum.)  Last BM:  01/03/2015  Height:   Ht Readings from Last 1 Encounters:  12/29/14 6' (1.829 m)    Weight:   Wt Readings from Last 1 Encounters:  12/29/14 156 lb 12 oz (71.1 kg)    Ideal Body Weight:  80.9 kg  BMI:  There is no weight on file to calculate BMI.  Estimated Nutritional Needs:   Kcal:  2200-2400  Protein:  110-130g  Fluid:  >=/ 2.2L  EDUCATION NEEDS:   No education needs identified at this time  Satira Anis. Patrik Turnbaugh,  MS, RD LDN After Hours/Weekend Pager [phone]

## 2015-01-05 NOTE — Progress Notes (Signed)
[facility] PROGRESS NOTE  Frank Krueger FYB:[id number] DOB: [birth year] DOA: 01/02/2015 PCP: No primary care provider on file.  Brief Summary  Frank Krueger is a 69 y.o. Male with a history of COPD, high cholesterol, hypertension, lung cancer with metastasis to brain status post radiation therapy and on decadron, hyponatremia with SIADH, diabetes type 2. Patient has been hospitalized essentially from 8/19 until this morning for sepsis, SIADH, hyponatremia. The patient was finally released and was taken via ambulance to the [facility] clinic in [place] in order to receive his medications. The patient's sodium level had been stable at approximately 125 and the patient continued to have some intermittent episodes of confusion.  At the [facility] clinic, the patient was seen by the [facility] physician, who refused to write his prescriptions and directed the patient back to the hospital to be under the care of medical oncology.  The patient's oncologist is through the [place] and he has received special permission to be otherwise treated at [facility].  On reevaluation in the emergency apartment, the patient's blood sugar was 371, the patient was orthostatic, and more confused then this morning when the patient was discharged. The daughter notes no palliating or provoking factors to the confusion, but notes that this is intermittent in nature and likely related to his brain metastases for which he recently completed XRT.    Assessment/Plan  Sepsis likely secondary to post-obstructive pneumonia/HCAP, fever to 102.55F, tachycardia, tachypnea, resolving -  Sepsis protocol ordered with IVF -  blood cultures no growth to date -  Discontinue vancomycin, ceftaz  -  Start ceftriaxone and continue flagyl which may be converted to by mouth  Candidal UTI -  Fluconazole 269m daily x 14 days, last day on 9/21. -  Monitor carefully for seizures.  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  Sodium trending down with IVF for his sepsis.   -  Repeat sodium in AM -  Continue sodium tabs and free water restriction  Lung cancer metastatic to the brain -  Just completed brain radiation by Dr. MLisbeth Renshaw-  Continue decadron -  [facility] however, he is not a good candidate for chemotherapy at this time secondary to his weight loss, cachexia, progressive weakness, and current sepsis -  Patient understands that he is not a candidate for surgery or chemotherapy at this time, but he "wants to live" and wants [place] stop the progression of cancer.  I explained that radiation, which is currently the only tool left in our arsenal for him at this time, will not stop his cancer.  He and his daughter met with palliative care and are in the process of deliberating about his prognosis and GOC, including the possibility of DNR and hospice care.    Diabetes type 2, still hyperglycemic -  Increase to lantus 30 units BID -  Start standing meal time insulin 5 units -  Continue high dose SSI  Essential hypertension with borderline hypotension this morning -  Hold his BP medications except for metoprolol for now and start resuming if blood pressure rises  Sinus tachycardia likely related to sepsis and malignancy, resolved with IV fluids and antibiotics -  TSH wnl -  D-dimer likely to be elevated in setting of infection and he appears to have pneumonia on CXR as a more likely explanation for his tachycardia than PE at this time  Microcytic anemia, occult  negative and TSH wnl, hemoglobin increased as expected with blood transfusion -  Iron studies, B12, folate with morning labs tomorrow -  Transfuse 1 unit PRBCs on 9/8  Leukocytosis, likely due to malignancy, steroids, and possible sepsis, stable  Diet:  Dysphagia 3 with thin liquid Access:  PIV IVF:  Off Proph:  lovenox  Code Status:  DNR after conversation with patient and  daughter Family Communication: patient and his daughter Disposition Plan:  Pending improvement in fever, on oral antibiotics to home with home health services but with phone number for hospice to call when he becomes more ill.  He does not want to be rehospitalized but would like acute problems to be addressed by his oncologist until he is set up with hospice care.  I have left messages for his oncologist Dr. Leilani Merl and his primary care doctor Dr. Francia Greaves 2, in order to arrange for outpatient management of medical problems until he enters hospice care or to make sure that he is able to receive his prescriptions from the [place] going forward.  Consultants:  Palliative care  Procedures:  Swallow eval  CXR  Antibiotics:  Vancomycin, ceftaz, and flagyl on 9/7 >   HPI/Subjective:  He denies cough, SOB, nausea, vomiting, diarrhea, abdominal pain, dysuria. He is confused.    Objective: Filed Vitals:   01/04/15 1213 01/04/15 1428 01/04/15 2153 01/05/15 0516  BP: 132/60 138/66 152/72 127/71  Pulse: 79 64 98 73  Temp: 97.9 F (36.6 C) 98.1 F (36.7 C) 97.5 F (36.4 C) 97.7 F (36.5 C)  TempSrc: Oral Oral Oral Oral  Resp: _0 SpO2: 91%  96% 95%    Intake/Output Summary (Last 24 hours) at 01/05/15 1438 Last data filed at 01/05/15 1300  Gross per 24 hour  Intake   1080 ml  Output   3625 ml  Net  -2545 ml   There were no vitals filed for this visit. There is no weight on file to calculate BMI.  Exam:   General:  Adult male, No acute distress, in bed and tired appearing  HEENT:  NCAT, MMM  Cardiovascular:  RRR, nl S1, S2 no mrg, 2+ pulses, warm extremities  Respiratory:  Diminished at the right base without obvious rales or rhonchi or wheeze, no increased WOB  Abdomen:   NABS, soft, NT/ND  MSK:   Normal tone and bulk, no LEE  Neuro:  Diffusely weak  Data Reviewed: Basic Metabolic Panel:  Recent Labs Lab 12/31/14 0709 01/01/15 0348 01/02/15 0500  01/02/15 1407 01/03/15 0525 01/04/15 0512 01/05/15 0510  NA 123* 125* 126* 120* 127* 124* 123*  K 4.5 4.8 4.8 4.6 5.1 4.8 4.6  CL 87* 88* 89* 85* 91* 90* 89*  CO2 _1 GLUCOSE 163* 219* 165* 371* 38* 240* 279*  BUN _2 23* 15 14  CREATININE 0.54* 0.63 0.57* 0.64 0.61 0.53* 0.58*  CALCIUM 8.7* 8.4* 8.6* 8.6* 8.4* 8.3* 8.3*  MG 1.4* 1.6* 1.6*  --   --   --   --    Liver Function Tests:  Recent Labs Lab 01/01/15 0348 01/02/15 0500  AST 42* 50*  ALT 54 65*  ALKPHOS 207* 226*  BILITOT 0.4 0.3  PROT 5.8* 5.7*  ALBUMIN 1.2* 1.2*   No results for input(s): LIPASE, AMYLASE in the last 168 hours. No results for input(s): AMMONIA in the last 168 hours. CBC:  Recent Labs Lab 01/01/15 0348 01/02/15 0500 01/02/15  1407 01/03/15 0525 01/04/15 0512 01/05/15 0510  WBC 27.1* 26.6* 23.9* 28.4* 25.0* 25.1*  NEUTROABS 24.6*  --   --   --   --   --   HGB 8.7* 8.7* 8.8* 8.5* 7.3* 8.6*  HCT 26.9* 26.3* 27.4* 26.4* 23.5* 25.8*  MCV 73.9* 73.7* 73.7* 73.9* 74.1* 73.9*  PLT 473* 429* 462* 439* 368 331    Recent Results (from the past 240 hour(s))  Culture, Urine     Status: None   Collection Time: 12/27/14  4:59 PM  Result Value Ref Range Status   Specimen Description URINE, CLEAN CATCH  Final   Special Requests NONE  Final   Culture >=100,000 COLONIES/mL YEAST  Final   Report Status 12/29/2014 FINAL  Final  Culture, blood (routine x 2)     Status: None (Preliminary result)   Collection Time: 01/02/15 10:29 PM  Result Value Ref Range Status   Specimen Description BLOOD RIGHT ANTECUBITAL  Final   Special Requests BOTTLES DRAWN AEROBIC AND ANAEROBIC 5CC  Final   Culture NO GROWTH 3 DAYS  Final   Report Status PENDING  Incomplete  Culture, blood (routine x 2)     Status: None (Preliminary result)   Collection Time: 01/02/15 10:32 PM  Result Value Ref Range Status   Specimen Description BLOOD LEFT ANTECUBITAL  Final   Special Requests BOTTLES DRAWN AEROBIC  AND ANAEROBIC 5CC  Final   Culture NO GROWTH 3 DAYS  Final   Report Status PENDING  Incomplete     Studies: No results found.  Scheduled Meds: . cefTRIAXone (ROCEPHIN)  IV  1 g Intravenous Q24H  . clopidogrel  75 mg Oral Daily  . dexamethasone  4 mg Oral QID  . diclofenac sodium  4 g Topical QID  . enoxaparin (LOVENOX) injection  40 mg Subcutaneous Q24H  . feeding supplement (ENSURE ENLIVE)  237 mL Oral BID BM  . feeding supplement (GLUCERNA SHAKE)  237 mL Oral TID BM  . fluconazole  200 mg Oral Daily  . gabapentin  300 mg Oral TID  . insulin aspart  0-20 Units Subcutaneous TID WC  . insulin aspart  0-5 Units Subcutaneous QHS  . insulin glargine  20 Units Subcutaneous BID  . levETIRAcetam  1,500 mg Oral BID  . magnesium chloride  1 tablet Oral BID  . metroNIDAZOLE  500 mg Oral 3 times per day  . pantoprazole  40 mg Oral Daily  . saccharomyces boulardii  250 mg Oral BID  . sodium chloride  1 g Oral BID WC   Continuous Infusions:    Active Problems:   Hyponatremia   DM2 (diabetes mellitus, type 2)   Metastatic cancer to brain   Orthostasis   Postobstructive pneumonia   Sepsis   Palliative care encounter   DNR (do not resuscitate) discussion    Time spent: 30 min    Michelina Mexicano, [facility] Pager [phone]. If 7PM-7AM, please contact night-coverage at [email or web address], password [facility] 01/05/2015, 2:38 PM  LOS: 3 days

## 2015-01-05 NOTE — Evaluation (Signed)
Physical Therapy Evaluation Patient Details Name: Frank Krueger MRN: [id number] DOB: 16-Aug-1945 Today's Date: 01/05/2015   History of Present Illness  Patient is a 69 y/o male with stage IV lung adenocarcinoma admitted with orthostasis, hyponatremia. Has been in hospital essentially since 8/19.  PMH includes HTN, cerebral aneurysm (with rupture), COPD, SDH, scoliosis, lung CA with brain mets and left craniotomy.  Clinical Impression  Patient presents with weakness, confusion, balance deficits and decreased endurance all affecting mobility and independence. Pt with poor safety awareness/judgment putting pt at increased risk for falls. Pt plans to go home with support from daughter/family. Motivated to regain strength and mobility. Pt will need hands on assist, gait belt and use of RW at home for all mobility. Daughter very aware and involved in care asking about a gait belt. Pt has all necessary DME at home. Recommend HHPT. Palliative care is following. Will follow acutely to maximize independence/mobility, ease burden of care and decrease fall risk prior to return home.    Follow Up Recommendations Home health PT;Supervision/Assistance - 24 hour    Equipment Recommendations  None recommended by PT    Recommendations for Other Services       Precautions / Restrictions Precautions Precautions: Fall Precaution Comments: poor safety awareness. Restrictions Weight Bearing Restrictions: No      Mobility  Bed Mobility               General bed mobility comments: Sitting in chair upon PT arrival.   Transfers Overall transfer level: Needs assistance Equipment used: Rolling walker (2 wheeled) Transfers: Sit to/from Stand Sit to Stand: Mod assist         General transfer comment: Mod A to boost from chair x2 with step by step cues for hand placement and sequencing (manual cues for hand placement at times). Increased time. Dizziness upon standing. Visible nystagmus- direction  changing and sway noted in standing. Cues for gaze stabilization. Sa02 dropped to 87% on RA.   Ambulation/Gait Ambulation/Gait assistance: Min assist;Mod assist Ambulation Distance (Feet): 20 Feet (+15') Assistive device: Rolling walker (2 wheeled) Gait Pattern/deviations: Step-to pattern;Decreased stride length;Trunk flexed;Shuffle;Drifts right/left Gait velocity: Decreased   General Gait Details: Right knee instability noted. Assist with RW management/proximity. Min A for balance initially progressing to Mod A when fatigued. + dizziness and sway noted initially with drifting observed.  Stairs            Wheelchair Mobility    Modified Rankin (Stroke Patients Only)       Balance Overall balance assessment: Needs assistance Sitting-balance support: Feet supported;No upper extremity supported Sitting balance-Leahy Scale: Fair     Standing balance support: During functional activity;Bilateral upper extremity supported Standing balance-Leahy Scale: Poor Standing balance comment: Requires BUE support and external support for stability/safety - poor safety awareness.                             Pertinent Vitals/Pain Pain Assessment: No/denies pain    Home Living Family/patient expects to be discharged to:: Private residence Living Arrangements: Alone Available Help at Discharge: Family;Available PRN/intermittently Type of Home: House Home Access: Stairs to enter Entrance Stairs-Rails: None Entrance Stairs-Number of Steps: 2 Home Layout: One level Home Equipment: Cane - single point;Shower seat;Adaptive equipment;Transport chair;Bedside commode;Walker - 2 wheels;Hospital bed      Prior Function Level of Independence: Needs assistance   Gait / Transfers Assistance Needed: Using [facility] ~ 64monthago however due to hospitalizations the last  couple months has needed assist for ambulation with RW.  ADL's / Homemaking Assistance Needed: requires (A) due to fall  risk        Hand Dominance   Dominant Hand: Right    Extremity/Trunk Assessment   Upper Extremity Assessment: Defer to OT evaluation;Generalized weakness           Lower Extremity Assessment: Generalized weakness      Cervical / Trunk Assessment: Kyphotic  Communication   Communication: HOH  Cognition Arousal/Alertness: Awake/alert Behavior During Therapy: Impulsive Overall Cognitive Status: Impaired/Different from baseline Area of Impairment: Orientation;Following commands;Safety/judgement;Awareness;Problem solving;Memory Orientation Level: Disoriented to;Time   Memory: Decreased short-term memory;Decreased recall of precautions Following Commands: Follows one step commands with increased time Safety/Judgement: Decreased awareness of safety;Decreased awareness of deficits Awareness: Intellectual Problem Solving: Slow processing;Difficulty sequencing;Requires verbal cues      General Comments General comments (skin integrity, edema, etc.): Daughter present during session.    Exercises        Assessment/Plan    PT Assessment Patient needs continued PT services  PT Diagnosis Difficulty walking;Abnormality of gait;Generalized weakness   PT Problem List Decreased strength;Cardiopulmonary status limiting activity;Decreased cognition;Decreased activity tolerance;Decreased balance;Decreased mobility;Decreased knowledge of precautions;Decreased safety awareness;Decreased knowledge of use of DME  PT Treatment Interventions DME instruction;Gait training;Stair training;Functional mobility training;Therapeutic activities;Therapeutic exercise;Patient/family education;Balance training;Cognitive remediation   PT Goals (Current goals can be found in the Care Plan section) Acute Rehab PT Goals Patient Stated Goal: to be able to go on his pontoon boat and fish PT Goal Formulation: With patient/family Time For Goal Achievement: 01/19/15 Potential to Achieve Goals: Fair     Frequency Min 3X/week   Barriers to discharge Decreased caregiver support;Inaccessible home environment 2 steps to enter home    Co-evaluation               End of Session Equipment Utilized During Treatment: Gait belt Activity Tolerance: Patient limited by fatigue Patient left: in chair;with call bell/phone within reach;with family/visitor present Nurse Communication: Mobility status         Time: 1131-1209 PT Time Calculation (min) (ACUTE ONLY): 38 min   Charges:   PT Evaluation $Initial PT Evaluation Tier I: 1 Procedure PT Treatments $Gait Training: 8-22 mins $Self Care/Home Management: 8-22   PT G Codes:        Holland Kotter A Raegan Sipp 01/05/2015, 12:46 PM Wray Kearns, [place], DPT [phone]

## 2015-01-06 LAB — CBC
HEMATOCRIT: 24.6 % — AB (ref 39.0–52.0)
Hemoglobin: 8.2 g/dL — ABNORMAL LOW (ref 13.0–17.0)
MCH: 24.8 pg — AB (ref 26.0–34.0)
MCHC: 33.3 g/dL (ref 30.0–36.0)
MCV: 74.5 fL — ABNORMAL LOW (ref 78.0–100.0)
Platelets: 308 10*3/uL (ref 150–400)
RBC: 3.3 MIL/uL — ABNORMAL LOW (ref 4.22–5.81)
RDW: 18.6 % — AB (ref 11.5–15.5)
WBC: 25.4 10*3/uL — ABNORMAL HIGH (ref 4.0–10.5)

## 2015-01-06 LAB — GLUCOSE, CAPILLARY
GLUCOSE-CAPILLARY: 324 mg/dL — AB (ref 65–99)
GLUCOSE-CAPILLARY: 383 mg/dL — AB (ref 65–99)
GLUCOSE-CAPILLARY: 354 mg/dL — AB (ref 65–99)
Glucose-Capillary: 334 mg/dL — ABNORMAL HIGH (ref 65–99)

## 2015-01-06 LAB — BASIC METABOLIC PANEL
Anion gap: 10 (ref 5–15)
BUN: 18 mg/dL (ref 6–20)
CALCIUM: 8.1 mg/dL — AB (ref 8.9–10.3)
CO2: 26 mmol/L (ref 22–32)
Chloride: 90 mmol/L — ABNORMAL LOW (ref 101–111)
Creatinine, Ser: 0.52 mg/dL — ABNORMAL LOW (ref 0.61–1.24)
GFR calc Af Amer: >60 mL/min (ref >60)
GLUCOSE: 180 mg/dL — AB (ref 65–99)
Potassium: 4.1 mmol/L (ref 3.5–5.1)
Sodium: 126 mmol/L — ABNORMAL LOW (ref 135–145)

## 2015-01-06 LAB — IRON AND TIBC
IRON: 16 ug/dL — AB (ref 45–182)
Saturation Ratios: 13 % — ABNORMAL LOW (ref 17.9–39.5)
TIBC: 122 ug/dL — AB (ref 250–450)
UIBC: 106 ug/dL

## 2015-01-06 LAB — VITAMIN B12: VITAMIN B 12: 982 pg/mL — AB (ref 180–914)

## 2015-01-06 LAB — FERRITIN: Ferritin: 3268 ng/mL — ABNORMAL HIGH (ref 24–336)

## 2015-01-06 LAB — FOLATE: Folate: 10.9 ng/mL (ref >5.9)

## 2015-01-06 MED ORDER — GABAPENTIN 300 MG PO CAPS: 300.0000 mg | ORAL_CAPSULE | Freq: Two times a day (BID) | ORAL | Status: AC

## 2015-01-06 MED ORDER — OMEPRAZOLE 20 MG PO CPDR: 20.0000 mg | DELAYED_RELEASE_CAPSULE | Freq: Every day | ORAL | Status: DC

## 2015-01-06 MED ORDER — INSULIN PEN NEEDLE 29G X 12MM MISC: 38.0000 [IU] | Freq: Every day | Status: DC

## 2015-01-06 MED ORDER — LEVETIRACETAM 750 MG PO TABS: 1500.0000 mg | ORAL_TABLET | Freq: Two times a day (BID) | ORAL | Status: DC

## 2015-01-06 MED ORDER — MAGNESIUM OXIDE -MG SUPPLEMENT 400 (240 MG) MG PO TABS: 1.0000 | ORAL_TABLET | Freq: Two times a day (BID) | ORAL | Status: DC

## 2015-01-06 MED ORDER — INSULIN GLARGINE 100 UNIT/ML SOLOSTAR PEN: 40.0000 [IU] | PEN_INJECTOR | Freq: Two times a day (BID) | SUBCUTANEOUS | Status: DC

## 2015-01-06 MED ORDER — DEXAMETHASONE 4 MG PO TABS: 4.0000 mg | ORAL_TABLET | Freq: Four times a day (QID) | ORAL | Status: DC

## 2015-01-06 MED ORDER — DOXYCYCLINE HYCLATE 100 MG PO TABS: 100.0000 mg | ORAL_TABLET | Freq: Two times a day (BID) | ORAL | Status: DC

## 2015-01-06 MED ORDER — INSULIN ASPART 100 UNIT/ML FLEXPEN: 14.0000 [IU] | PEN_INJECTOR | Freq: Three times a day (TID) | SUBCUTANEOUS | Status: DC

## 2015-01-06 MED ORDER — SACCHAROMYCES BOULARDII 250 MG PO CAPS: 250.0000 mg | ORAL_CAPSULE | Freq: Two times a day (BID) | ORAL | Status: DC

## 2015-01-06 MED ORDER — FLUCONAZOLE 200 MG PO TABS: 200.0000 mg | ORAL_TABLET | Freq: Every day | ORAL | Status: AC

## 2015-01-06 MED ORDER — CLOPIDOGREL BISULFATE 75 MG PO TABS: 75.0000 mg | ORAL_TABLET | Freq: Every day | ORAL | Status: DC
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
## 2015-01-06 NOTE — Care Management Note (Addendum)
Case Management Note  Patient Details  Name: Frank Krueger MRN: [id number] Date of Birth: Sep 11, 1945  Subjective/Objective:                   SIADH, hyponatremia Action/Plan: Discharge planning  Expected Discharge Date:  01/06/15               Expected Discharge Plan:  [place]  In-House Referral:     Discharge planning Services  CM Consult, Medication Assistance  Post Acute Care Choice:  Home Health Choice offered to:  Adult Children  DME Arranged:    DME Agency:  [place]  HH Arranged:  RN, PT, OT, Nurse's Aide [place] Agency:  [facility]  Status of Service:  Completed, signed off  Medicare Important Message Given:    Date Medicare IM Given:    Medicare IM give by:    Date Additional Medicare IM Given:    Additional Medicare Important Message give by:     If discussed at [place] of Stay Meetings, dates discussed:    Additional Comments: CM called CSW to arrange ambulance transport home for pt. CM confirmed with family the understanding pt will present to the [facility] with discharge summary and prescriptions to have medications filled.  Unfortunately, by the daughter choosing to not have her father admitted to the [place] hospital  (recommendation of the [facility]) and opted to have her father ambulance transported to [facility], the ancillary support system for post discharge medication and transportation is unavailable on the weekend Midwife).  No other CM needs were communicated. CM met with pt and daughter in room to offer choice of home health agency.  Family chooses [facility] to render HHPT/OT/RN/AIDE/SW.  Pt is not UNinsured so unfortunately does not meet criteria for [facility] program for med asst.  CM gave pt coupons for Humilin 70/30, Humolog, Keppra and Doxycycline.  Pt will need to get an appt with [facility] to have a [facility] MD follow for prescriptionSW home health order is to pursue home hospice; [place] aware.  No other  CM needs were communicated. Dellie Catholic, RN 01/06/2015, 1:25 PM

## 2015-01-07 LAB — BASIC METABOLIC PANEL
ANION GAP: 7 (ref 5–15)
BUN: 18 mg/dL (ref 6–20)
CALCIUM: 8.6 mg/dL — AB (ref 8.9–10.3)
CO2: 29 mmol/L (ref 22–32)
Chloride: 98 mmol/L — ABNORMAL LOW (ref 101–111)
Creatinine, Ser: 0.54 mg/dL — ABNORMAL LOW (ref 0.61–1.24)
Glucose, Bld: 195 mg/dL — ABNORMAL HIGH (ref 65–99)
Potassium: 4.4 mmol/L (ref 3.5–5.1)
Sodium: 134 mmol/L — ABNORMAL LOW (ref 135–145)

## 2015-01-07 LAB — GLUCOSE, CAPILLARY
GLUCOSE-CAPILLARY: 188 mg/dL — AB (ref 65–99)
Glucose-Capillary: 227 mg/dL — ABNORMAL HIGH (ref 65–99)
Glucose-Capillary: 287 mg/dL — ABNORMAL HIGH (ref 65–99)
Glucose-Capillary: 163 mg/dL — ABNORMAL HIGH (ref 65–99)

## 2015-01-07 LAB — CULTURE, BLOOD (ROUTINE X 2)
Culture: NO GROWTH
Culture: NO GROWTH

## 2015-01-07 LAB — CBC
HCT: 25.5 % — ABNORMAL LOW (ref 39.0–52.0)
HEMOGLOBIN: 8.4 g/dL — AB (ref 13.0–17.0)
MCH: 24.7 pg — ABNORMAL LOW (ref 26.0–34.0)
MCHC: 32.9 g/dL (ref 30.0–36.0)
MCV: 75 fL — ABNORMAL LOW (ref 78.0–100.0)
Platelets: 285 10*3/uL (ref 150–400)
RBC: 3.4 MIL/uL — AB (ref 4.22–5.81)
RDW: 18.9 % — ABNORMAL HIGH (ref 11.5–15.5)
WBC: 24.3 10*3/uL — ABNORMAL HIGH (ref 4.0–10.5)

## 2015-01-07 MED ORDER — DOXYCYCLINE HYCLATE 100 MG PO TABS
100.0000 mg | ORAL_TABLET | Freq: Two times a day (BID) | ORAL | Status: DC
  Administered 2015-01-07 – 2015-01-09 (×5): 100 mg via ORAL
  Filled 2015-01-07 (×5): qty 1

## 2015-01-07 MED ORDER — ACYCLOVIR 5 % EX OINT
TOPICAL_OINTMENT | CUTANEOUS | Status: DC
  Filled 2015-01-07: qty 30

## 2015-01-07 MED ORDER — INSULIN GLARGINE 100 UNIT/ML ~~LOC~~ SOLN
40.0000 [IU] | Freq: Two times a day (BID) | SUBCUTANEOUS | Status: DC
  Administered 2015-01-07 – 2015-01-08 (×3): 40 [IU] via SUBCUTANEOUS
  Filled 2015-01-07 (×4): qty 0.4

## 2015-01-07 MED ORDER — MAGIC MOUTHWASH W/LIDOCAINE
5.0000 mL | Freq: Four times a day (QID) | ORAL | Status: DC | PRN
  Administered 2015-01-07 – 2015-01-08 (×2): 5 mL via ORAL
  Filled 2015-01-07 (×4): qty 5

## 2015-01-07 MED ORDER — INSULIN ASPART 100 UNIT/ML ~~LOC~~ SOLN
14.0000 [IU] | Freq: Three times a day (TID) | SUBCUTANEOUS | Status: DC
  Administered 2015-01-07 (×3): 14 [IU] via SUBCUTANEOUS

## 2015-01-07 NOTE — Progress Notes (Signed)
Patient seen, examined and unchange.  He is stable for discharge.  Awaiting approval of transportation to home (or [place] ER) from the [place].  Family requesting prescriptions for magic mouthwash and voltaren gel which I will add to discharge medications.

## 2015-01-07 NOTE — Evaluation (Signed)
Occupational Therapy Evaluation and Discharge Patient Details Name: Frank Krueger MRN: [id number] DOB: 09-18-1945 Today's Date: 01/07/2015    History of Present Illness Patient is a 69 y/o male with stage IV lung adenocarcinoma admitted with orthostasis, hyponatremia. Has been in hospital essentially since 8/19.  PMH includes HTN, cerebral aneurysm (with rupture), COPD, SDH, scoliosis, lung CA with brain mets and left craniotomy.   Clinical Impression   This 69 yo male admitted with above presents to acute OT with decreased balance, decreased mobility, decreased cognition all affecting his ability to care for himself at home. He will benefit from continued [place] once D/C'd to continue to work on independence with BADLs. Acute OT will sign off due to probable D/C tomorrow.    Follow Up Recommendations  Home health OT;Supervision/Assistance - 24 hour    Equipment Recommendations  None recommended by OT       Precautions / Restrictions Precautions Precautions: Fall Precaution Comments: poor safety awareness. Restrictions Weight Bearing Restrictions: No      Mobility Bed Mobility Overal bed mobility: Needs Assistance Bed Mobility: Supine to Sit     Supine to sit: Supervision;HOB elevated (using rail)        Transfers Overall transfer level: Needs assistance Equipment used: Rolling walker (2 wheeled) Transfers: Sit to/from [facility] Sit to Stand: Min assist Stand pivot transfers: Mod assist       General transfer comment: Mod A ambulate 5 feet    Balance Overall balance assessment: Needs assistance Sitting-balance support: No upper extremity supported;Feet supported Sitting balance-Leahy Scale: Fair     Standing balance support: Bilateral upper extremity supported;During functional activity Standing balance-Leahy Scale: Poor                              ADL Overall ADL's : Needs assistance/impaired Eating/Feeding:  Independent;Sitting   Grooming: Set up;Sitting   Upper Body Bathing: Set up;Sitting   Lower Body Bathing: Moderate assistance (with min A sit<>stand)   Upper Body Dressing : Minimal assistance;Sitting   Lower Body Dressing: Moderate assistance (with min A sit<>stand)   Toilet Transfer: Moderate assistance;Ambulation;RW (due to decreased safety with RW (getting it too far in front of him))   Toileting- Clothing Manipulation and Hygiene: Total assistance (with min A sit<>stand)               Vision Additional Comments: Wears glasses          Pertinent Vitals/Pain Pain Assessment: Faces Faces Pain Scale: Hurts a little bit Pain Location: bottom Pain Descriptors / Indicators: Sore Pain Intervention(s): Monitored during session;Repositioned (NT helped clean pt's back peri area and applied barrier lotion)     Hand Dominance Right   Extremity/Trunk Assessment Upper Extremity Assessment Upper Extremity Assessment: Generalized weakness           Communication Communication Communication: HOH   Cognition Arousal/Alertness: Awake/alert Behavior During Therapy: WFL for tasks assessed/performed Overall Cognitive Status: Impaired/Different from baseline Area of Impairment: Safety/judgement         Safety/Judgement: Decreased awareness of safety;Decreased awareness of deficits     General Comments: Pt is not aware of deficits and dec safety w/ ambulation              Home Living Family/patient expects to be discharged to:: Private residence Living Arrangements: Alone Available Help at Discharge: Family;Available 24 hours/day Type of Home: House Home Access: Stairs to enter [employer] of Steps: 2 Entrance Stairs-Rails:  None Home Layout: One level     Bathroom Shower/Tub: Teacher, early years/pre: Standard     Home Equipment: Cane - single point;Shower seat;Adaptive equipment;Transport chair;Bedside commode;Walker - 2  wheels;Hospital bed Adaptive Equipment: Reacher        Prior Functioning/Environment Level of Independence: Needs assistance  Gait / Transfers Assistance Needed: Using [facility] ~ 67monthago however due to hospitalizations the last couple months has needed assist for ambulation with RW. ADL's / Homemaking Assistance Needed: requires (A) due to fall risk        OT Diagnosis: Generalized weakness;Cognitive deficits   OT Problem List: Decreased strength;Impaired balance (sitting and/or standing);Decreased cognition      OT Goals(Current goals can be found in the care plan section) Acute Rehab OT Goals Patient Stated Goal: home hopefully tomorrow per daughter (once can get transportation arranged with [facility])  OT Frequency:                End of Session Equipment Utilized During Treatment: Gait belt;Rolling walker Nurse Communication:  (and NT: pt at window without call bell (won't reach) but has chair alarm on )  Activity Tolerance: Patient tolerated treatment well Patient left: in chair;with chair alarm set (with phone and cell phone (call bell would not reach due to pt wanted to sit so he could see out window)   Time: 08921-1941OT Time Calculation (min): 29 min Charges:  OT General Charges $OT Visit: 1 Procedure OT Evaluation $Initial OT Evaluation Tier I: 1 Procedure OT Treatments $Self Care/Home Management : 8-22 mins  LAlmon [phone]/02/2015, 9:20 AM

## 2015-01-07 NOTE — Progress Notes (Signed)
[facility] PROGRESS NOTE  DAREL RICKETTS KZL:[id number] DOB: 1946/04/06 DOA: 01/02/2015 PCP: No primary care provider on file.  Brief Summary  Frank Krueger is a 69 y.o. Male with a history of COPD, high cholesterol, hypertension, lung cancer with metastasis to brain status post radiation therapy and on decadron, hyponatremia with SIADH, diabetes type 2. Patient has been hospitalized essentially from 8/19 until this morning for sepsis, SIADH, hyponatremia. The patient was finally released and was taken via ambulance to the [facility] clinic in [place] in order to receive his medications. The patient's sodium level had been stable at approximately 125 and the patient continued to have some intermittent episodes of confusion.  At the [facility] clinic, the patient was seen by the [facility] physician, who refused to write his prescriptions and directed the patient back to the hospital to be under the care of medical oncology.  The patient's oncologist is through the [place] and he has received special permission to be otherwise treated at [facility].  On reevaluation in the emergency apartment, the patient's blood sugar was 371, the patient was orthostatic, and more confused then this morning when the patient was discharged. The daughter notes no palliating or provoking factors to the confusion, but notes that this is intermittent in nature and likely related to his brain metastases for which he recently completed XRT.    Assessment/Plan  Sepsis likely secondary to post-obstructive pneumonia/HCAP, fever to 102.95F, tachycardia, tachypnea, resolving -  blood cultures no growth to date -  D/c ceftriaxone and flagyl and change to doxycycline  Candidal UTI -  Fluconazole '200mg'$  daily x 14 days, last day on 9/21.  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  Sodium trending up. -  No further labs -  Continue sodium tabs and free water restriction  Lung cancer metastatic to the brain -  Just completed brain radiation  by Dr. Lisbeth Renshaw -  Continue decadron -  Not a good candidate for chemotherapy at this time secondary to his weight loss, cachexia  Diabetes type 2, still hyperglycemic -  Resume home lantus 40 units BID with 14 units aspart with meals  Essential hypertension BP stable -  Continue to monitor  Sinus tachycardia likely related to sepsis and malignancy, resolved with IV fluids and antibiotics.  TSH wnl.  Anemia of chronic disease/inflammation from cancer and possible iron deficiency, occult negative and TSH wnl, hemoglobin stable and 1 unit blood transfusion on 9/8  Leukocytosis, likely due to malignancy, steroids, and possible sepsis, stable  Diet:  Dysphagia 3 with thin liquid Access:  PIV IVF:  Off Proph:  lovenox  Code Status:  DNR  Family Communication: patient and his daughter Disposition Plan:  Awaiting approval for transportation to home from [place].  Family declines SNF.  Will need to go to [facility] care to have medications filled if primary care doctor and oncologist unwilling to fill prescription.  Consultants:  Palliative care  Procedures:  Swallow eval  CXR  Antibiotics:  Vancomycin, ceftaz, and flagyl  Doxycycline 9/11 >  HPI/Subjective:  He denies cough, SOB, nausea, vomiting, diarrhea, abdominal pain, dysuria. Feels weak.  Objective: Filed Vitals:   01/06/15 1421 01/06/15 2145 01/07/15 0445 01/07/15 1408  BP: 133/83 147/65 128/69 137/75  Pulse: 93 89 75 89  Temp: 97.4 F (36.3 C) 97.8 F (36.6 C) 97.5 F (36.4 C) 98.2 F (36.8 C)  TempSrc: Oral Oral  Oral  Resp: '17 17 17 16  '$ SpO2: 93% 95% 93% 96%    Intake/Output  Summary (Last 24 hours) at 01/07/15 1803 Last data filed at 01/07/15 1409  Gross per 24 hour  Intake    342 ml  Output   1900 ml  Net  -1558 ml   There were no vitals filed for this visit. There is no weight on file to calculate BMI.  Exam:   General:  Adult male, sitting in chair by window  HEENT:  NCAT, MMM  Cardiovascular:   RRR, nl S1, S2 no mrg, 2+ pulses, warm extremities  Respiratory:  CTAB, no increased WOB  Abdomen:   NABS, soft, NT/ND  MSK:   Normal tone and bulk, no LEE  Neuro:  Diffusely weak  Data Reviewed: Basic Metabolic Panel:  Recent Labs Lab 01/01/15 0348 01/02/15 0500  01/03/15 0525 01/04/15 0512 01/05/15 0510 01/06/15 0530 01/07/15 0511  NA 125* 126*  < > 127* 124* 123* 126* 134*  K 4.8 4.8  < > 5.1 4.8 4.6 4.1 4.4  CL 88* 89*  < > 91* 90* 89* 90* 98*  CO2 27 28  < > '26 24 25 26 29  '$ GLUCOSE 219* 165*  < > 38* 240* 279* 180* 195*  BUN 20 17  < > 23* '15 14 18 18  '$ CREATININE 0.63 0.57*  < > 0.61 0.53* 0.58* 0.52* 0.54*  CALCIUM 8.4* 8.6*  < > 8.4* 8.3* 8.3* 8.1* 8.6*  MG 1.6* 1.6*  --   --   --   --   --   --   < > = values in this interval not displayed. Liver Function Tests:  Recent Labs Lab 01/01/15 0348 01/02/15 0500  AST 42* 50*  ALT 54 65*  ALKPHOS 207* 226*  BILITOT 0.4 0.3  PROT 5.8* 5.7*  ALBUMIN 1.2* 1.2*   No results for input(s): LIPASE, AMYLASE in the last 168 hours. No results for input(s): AMMONIA in the last 168 hours. CBC:  Recent Labs Lab 01/01/15 0348  01/03/15 0525 01/04/15 0512 01/05/15 0510 01/06/15 0530 01/07/15 0511  WBC 27.1*  < > 28.4* 25.0* 25.1* 25.4* 24.3*  NEUTROABS 24.6*  --   --   --   --   --   --   HGB 8.7*  < > 8.5* 7.3* 8.6* 8.2* 8.4*  HCT 26.9*  < > 26.4* 23.5* 25.8* 24.6* 25.5*  MCV 73.9*  < > 73.9* 74.1* 73.9* 74.5* 75.0*  PLT 473*  < > 439* 368 331 308 285  < > = values in this interval not displayed.  Recent Results (from the past 240 hour(s))  Culture, blood (routine x 2)     Status: None   Collection Time: 01/02/15 10:29 PM  Result Value Ref Range Status   Specimen Description BLOOD RIGHT ANTECUBITAL  Final   Special Requests BOTTLES DRAWN AEROBIC AND ANAEROBIC 5CC  Final   Culture NO GROWTH 5 DAYS  Final   Report Status 01/07/2015 FINAL  Final  Culture, blood (routine x 2)     Status: None   Collection  Time: 01/02/15 10:32 PM  Result Value Ref Range Status   Specimen Description BLOOD LEFT ANTECUBITAL  Final   Special Requests BOTTLES DRAWN AEROBIC AND ANAEROBIC 5CC  Final   Culture NO GROWTH 5 DAYS  Final   Report Status 01/07/2015 FINAL  Final     Studies: No results found.  Scheduled Meds: . clopidogrel  75 mg Oral Daily  . dexamethasone  4 mg Oral QID  . diclofenac sodium  4 g Topical  QID  . doxycycline  100 mg Oral Q12H  . enoxaparin (LOVENOX) injection  40 mg Subcutaneous Q24H  . feeding supplement (ENSURE ENLIVE)  237 mL Oral BID BM  . feeding supplement (GLUCERNA SHAKE)  237 mL Oral TID BM  . fluconazole  200 mg Oral Daily  . gabapentin  300 mg Oral TID  . insulin aspart  14 Units Subcutaneous TID WC  . insulin glargine  40 Units Subcutaneous BID  . levETIRAcetam  1,500 mg Oral BID  . magnesium chloride  1 tablet Oral BID  . pantoprazole  40 mg Oral Daily  . saccharomyces boulardii  250 mg Oral BID  . sodium chloride  1 g Oral BID WC   Continuous Infusions:    Principal Problem:   Postobstructive pneumonia Active Problems:   Hyponatremia   DM2 (diabetes mellitus, type 2)   Metastatic cancer to brain   Orthostasis   Sepsis   Palliative care encounter   DNR (do not resuscitate) discussion    Time spent: 30 min    Allyssa Abruzzese, [place] [facility] Pager [phone]. If 7PM-7AM, please contact night-coverage at [email or web address], password [facility] 01/07/2015, 6:03 PM  LOS: 5 days

## 2015-01-08 ENCOUNTER — Other Ambulatory Visit (HOSPITAL_COMMUNITY): Payer: Non-veteran care

## 2015-01-08 ENCOUNTER — Inpatient Hospital Stay (HOSPITAL_COMMUNITY): Payer: Non-veteran care

## 2015-01-08 LAB — CBC
HCT: 26.6 % — ABNORMAL LOW (ref 39.0–52.0)
Hemoglobin: 8.8 g/dL — ABNORMAL LOW (ref 13.0–17.0)
MCH: 24.9 pg — ABNORMAL LOW (ref 26.0–34.0)
MCHC: 33.1 g/dL (ref 30.0–36.0)
MCV: 75.4 fL — ABNORMAL LOW (ref 78.0–100.0)
PLATELETS: 258 10*3/uL (ref 150–400)
RBC: 3.53 MIL/uL — AB (ref 4.22–5.81)
RDW: 19.1 % — AB (ref 11.5–15.5)
WBC: 26.4 10*3/uL — AB (ref 4.0–10.5)

## 2015-01-08 LAB — COMPREHENSIVE METABOLIC PANEL
ALK PHOS: 330 U/L — AB (ref 38–126)
ALT: 48 U/L (ref 17–63)
AST: 45 U/L — AB (ref 15–41)
Albumin: <1 g/dL — ABNORMAL LOW (ref 3.5–5.0)
Anion gap: 10 (ref 5–15)
BILIRUBIN TOTAL: 0.4 mg/dL (ref 0.3–1.2)
BUN: 20 mg/dL (ref 6–20)
CALCIUM: 8.5 mg/dL — AB (ref 8.9–10.3)
CHLORIDE: 95 mmol/L — AB (ref 101–111)
CO2: 27 mmol/L (ref 22–32)
CREATININE: 0.53 mg/dL — AB (ref 0.61–1.24)
Glucose, Bld: 84 mg/dL (ref 65–99)
Potassium: 4.2 mmol/L (ref 3.5–5.1)
Sodium: 132 mmol/L — ABNORMAL LOW (ref 135–145)
TOTAL PROTEIN: 5.3 g/dL — AB (ref 6.5–8.1)

## 2015-01-08 LAB — GLUCOSE, CAPILLARY
GLUCOSE-CAPILLARY: 59 mg/dL — AB (ref 65–99)
GLUCOSE-CAPILLARY: 24 mg/dL — AB (ref 65–99)
Glucose-Capillary: 129 mg/dL — ABNORMAL HIGH (ref 65–99)
Glucose-Capillary: 88 mg/dL (ref 65–99)
Glucose-Capillary: 75 mg/dL (ref 65–99)
Glucose-Capillary: 105 mg/dL — ABNORMAL HIGH (ref 65–99)

## 2015-01-08 LAB — LACTIC ACID, PLASMA: LACTIC ACID, VENOUS: 1.7 mmol/L (ref 0.5–2.0)

## 2015-01-08 MED ORDER — DEXTROSE 50 % IV SOLN
INTRAVENOUS | Status: AC
  Administered 2015-01-08: 17:00:00
  Filled 2015-01-08: qty 50

## 2015-01-08 MED ORDER — GLIPIZIDE 5 MG PO TABS
5.0000 mg | ORAL_TABLET | Freq: Every day | ORAL | Status: DC
  Filled 2015-01-08: qty 1

## 2015-01-08 MED ORDER — INSULIN ASPART 100 UNIT/ML FLEXPEN: 10.0000 [IU] | PEN_INJECTOR | Freq: Three times a day (TID) | SUBCUTANEOUS | Status: DC

## 2015-01-08 MED ORDER — SODIUM CHLORIDE 0.9 % IV BOLUS (SEPSIS)
1000.0000 mL | Freq: Once | INTRAVENOUS | Status: AC
  Administered 2015-01-08: 1000 mL via INTRAVENOUS

## 2015-01-08 MED ORDER — HALOPERIDOL 1 MG PO TABS: 0.5000 mg | ORAL_TABLET | ORAL | Status: DC | PRN

## 2015-01-08 MED ORDER — LORAZEPAM 1 MG PO TABS: 1.0000 mg | ORAL_TABLET | ORAL | Status: DC | PRN

## 2015-01-08 MED ORDER — ALLEVYN ADHESIVE EX PADS: 1.0000 | MEDICATED_PAD | CUTANEOUS | Status: AC

## 2015-01-08 MED ORDER — HALOPERIDOL LACTATE 5 MG/ML IJ SOLN: 0.5000 mg | INTRAMUSCULAR | Status: DC | PRN

## 2015-01-08 MED ORDER — MORPHINE SULFATE 15 MG PO TABS: 15.0000 mg | ORAL_TABLET | ORAL | Status: DC | PRN

## 2015-01-08 MED ORDER — DICLOFENAC SODIUM 1 % TD GEL: 4.0000 g | Freq: Four times a day (QID) | TRANSDERMAL | Status: AC

## 2015-01-08 MED ORDER — GABAPENTIN 300 MG PO CAPS
300.0000 mg | ORAL_CAPSULE | Freq: Two times a day (BID) | ORAL | Status: DC
  Administered 2015-01-08 – 2015-01-09 (×2): 300 mg via ORAL
  Filled 2015-01-08 (×2): qty 1

## 2015-01-08 MED ORDER — MAGIC MOUTHWASH W/LIDOCAINE: 5.0000 mL | Freq: Four times a day (QID) | ORAL | Status: AC | PRN

## 2015-01-08 MED ORDER — LORAZEPAM 2 MG/ML IJ SOLN: 1.0000 mg | INTRAMUSCULAR | Status: DC | PRN

## 2015-01-08 MED ORDER — GLUCERNA SHAKE PO LIQD: 237.0000 mL | Freq: Three times a day (TID) | ORAL | Status: AC

## 2015-01-08 MED ORDER — MORPHINE SULFATE (CONCENTRATE) 10 MG/0.5ML PO SOLN: 5.0000 mg | ORAL | Status: DC | PRN

## 2015-01-08 MED ORDER — LORAZEPAM 2 MG/ML PO CONC: 1.0000 mg | ORAL | Status: DC | PRN

## 2015-01-08 MED ORDER — INSULIN ASPART 100 UNIT/ML ~~LOC~~ SOLN: 10.0000 [IU] | Freq: Three times a day (TID) | SUBCUTANEOUS | Status: DC

## 2015-01-08 MED ORDER — INSULIN GLARGINE 100 UNIT/ML SOLOSTAR PEN: 30.0000 [IU] | PEN_INJECTOR | Freq: Two times a day (BID) | SUBCUTANEOUS | Status: DC

## 2015-01-08 MED ORDER — SODIUM CHLORIDE 1 G PO TABS: 1.0000 g | ORAL_TABLET | Freq: Two times a day (BID) | ORAL | Status: DC

## 2015-01-08 MED ORDER — HALOPERIDOL LACTATE 2 MG/ML PO CONC
0.5000 mg | ORAL | Status: DC | PRN
  Filled 2015-01-08: qty 0.3

## 2015-01-08 MED ORDER — ATROPINE SULFATE 1 % OP SOLN
4.0000 [drp] | OPHTHALMIC | Status: DC | PRN
  Administered 2015-01-09: 4 [drp] via SUBLINGUAL
  Filled 2015-01-08: qty 5

## 2015-01-08 NOTE — Care Management (Signed)
Met with patient and his daughter [name] at bedside and had [employer] SW  [address] [place] , pager [phone] at [facility] on phone .   Per Ms Mock home health is set up through [place] and aide through [facility] 3 times a week 2 hours at a time.   Ms Mock has spoken with patient's PCP DR Armour , Dr Francia Greaves does not feel it necessary that patient be seen by him today at discharge . Patient has follow up appointment September 27 at [place] , daughter aware . Since patient is discharging from hospital to home and not going to a [place] appointment , [place] will not arrange transportation home. Michelle aware and requesting ambulance transport home. [facility] SW aware.   [name] also requesting hoyer lift for home , stating it does not have to be delivered before patient is discharged . Ms Mock instructed NCM PT would have to recommend hoyer lift and document education done with patient and daughter and MD order faxed . PT and MD aware. Will fax to Ms Mock.   [name] requesting additional prescriptions be written . Dr Sheran Fava aware and wrote scripts .   Ms Mock instructed NCM Dr Francia Greaves will accept [facility] prescriptions and fax prescriptions and clinical to [phone] , Dr Armour will review and over night prescriptions to patient . Michelle aware .   [name] also has Ms Visual merchandiser.

## 2015-01-08 NOTE — Progress Notes (Signed)
Notified by Rosario Adie of pt./family request for [facility] services at home after discharge.  Writer spoke with pt's daughter [name] at the bedside to initiate education related to hospice philosophy, services and team approach to care. [name] voiced understanding of information provided. Daughter requests that foley catheter be left in at discharge for comfort. Per discussion plan is for discharge to home by [facility] tomorrow.  Please send signed and completed DNR form home with pt./family. Pt. Will need prescriptions for discharge comfort medications.  DME needs discussed and pt. Currently has an electric bed and other DME in the home through the [place]. MD and family requesting oxygen in the home. [facility] equipment manager Jewel Ysidro Evert notified and will contact [facility] to arrange delivery to the home. The home address has been verified and is correct in the chart. [name] is the family member to be contacted to arrange time of delivery.  [facility] aware of above. Please notify [facility] when pt. Is ready to leave unit at discharge- call [phone] ( or [phone] after 5 pm). [facility] inofrmation and contact numbers have been given to pt's daughter during visit. Above information shared with Magdalen Spatz, CMRN. Please call with any questions.  [facility] Liaison [phone]

## 2015-01-08 NOTE — Progress Notes (Signed)
SATURATION QUALIFICATIONS: (This note is used to comply with regulatory documentation for home oxygen)  Patient Saturations on Room Air at Rest = 79%  Patient Saturations on Room Air while Ambulating = n/a  Patient Saturations on Liters of oxygen while Ambulating = n/a  Please briefly explain why patient needs home oxygen:Patient O2 saturation  levels drop to critical levels without oxygen

## 2015-01-08 NOTE — Progress Notes (Signed)
[facility] PROGRESS NOTE  Frank Krueger IZT:[id number] DOB: 18-Sep-1945 DOA: 01/02/2015 PCP: No primary care provider on file.  Brief Summary  Frank Krueger is a 69 y.o. Male with a history of COPD, high cholesterol, hypertension, lung cancer with metastasis to brain status post radiation therapy and on decadron, hyponatremia with SIADH, diabetes type 2. Patient has been hospitalized essentially from 8/19 until this morning for sepsis, SIADH, hyponatremia. The patient was finally released and was taken via ambulance to the [facility] clinic in [place] in order to receive his medications. The patient's sodium level had been stable at approximately 125 and the patient continued to have some intermittent episodes of confusion.  At the [facility] clinic, the patient was seen by the [facility] physician, who refused to write his prescriptions and directed the patient back to the hospital to be under the care of medical oncology.  The patient's oncologist is through the [place] and he has received special permission to be otherwise treated at [facility].  On reevaluation in the emergency apartment, the patient's blood sugar was 371, the patient was orthostatic, and more confused then this morning when the patient was discharged. The daughter notes no palliating or provoking factors to the confusion, but notes that this is intermittent in nature and likely related to his brain metastases for which he recently completed XRT.    Patient has been declining over the last few days and today appears particularly ill.  He has become increasingly hypoxic which I believe is due to his progressive cancer causing malnutrition and albumin < 1.  His CXR demonstrated persistent pneumonia which is not improving with antibiotics and probably some of the infiltrate is related to pulmonary edema from hypoalbuminemia.   I think he is succumbing to his cancer faster than we had anticipated.  Met with palliative care and daughter today to discuss the change  in his health and his poor prognosis and transitioning to comfort measures.  Daughter is sad but understanding.  Notified CM and SW to find out how to get him enrolled in [place] hosp[facility].  Would like to get him home as soon as possible with hospice care.    Assessment/Plan  Sepsis likely secondary to post-obstructive pneumonia/HCAP, fever to 102.44F, tachycardia, tachypnea, resolving -  blood cultures no growth to date -  Continue doxycycline  Acute hypoxic respiratory failure secondary to pneumonia and probably hypoalbuminemia from malnutrition from cancer -  We will set up for home oxygen to hospice  Candidal UTI -  Fluconazole $RemoveBefo'200mg'[id number]$  daily x 14 days, last day on 9/21.  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  Sodium trending up. 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 Lisbeth Renshaw -  Continue decadron -  Not a good candidate for chemotherapy at this time secondary to his weight loss, cachexia, severe malnutrition  Diabetes type 2, hypoglycemic again this morning.   -  D/c CBG and insulin for comfort  Essential hypertension BP stable  Sinus tachycardia likely related to sepsis and malignancy, initially improved with with IV fluids and antibiotics.  TSH wnl.   -  Tachycardia recurred this morning, I believe this is a sign of progression of disease, worsening illness  Anemia of chronic disease/inflammation from cancer and possible iron deficiency, occult negative and TSH wnl, hemoglobin stable and 1 unit blood transfusion on 9/8  Leukocytosis, likely due to malignancy, steroids, and possible sepsis, stable  Diet:  regular Access:  PIV IVF:  Off Proph:  None, comfort measures only  Code Status:  DNR  Family Communication: patient and his daughter Disposition Plan:   Plan for home with hospice  Consultants:  Palliative care  Procedures:  Swallow eval  CXR  Antibiotics:  Vancomycin, ceftaz, and  flagyl  Doxycycline 9/11 >  HPI/Subjective:  Delirious today. Unable to provide reliable history.  Objective: Filed Vitals:   01/08/15 0543 01/08/15 1347 01/08/15 1400 01/08/15 1433  BP: 148/76 146/64    Pulse: 75 112    Temp: 97.9 F (36.6 C) 98.8 F (37.1 C)    TempSrc:  Oral    Resp: 16 16    SpO2: 93% 93% 93% 79%    Intake/Output Summary (Last 24 hours) at 01/08/15 1507 Last data filed at 01/08/15 0630  Gross per 24 hour  Intake    358 ml  Output   1150 ml  Net   -792 ml   There were no vitals filed for this visit. There is no weight on file to calculate BMI.  Exam:   General:  Cachectic/frail male, lying delirious in bed, mildly cyanotic and ill-appearing  HEENT:  NCAT, MMM  Cardiovascular:  Tachycardic RR, nl S1, S2 no mrg, 2+ pulses, warm extremities  Respiratory:  Diminished bilateral breath sounds at the bases, faint rales at the bases, no wheezes or rhonchi, no increased WOB  Abdomen:   NABS, soft, NT/ND  MSK:   Normal tone and bulk, 1+ bilateral pitting LEE  Neuro:  Diffusely weak  Data Reviewed: Basic Metabolic Panel:  Recent Labs Lab 01/02/15 0500  01/04/15 0512 01/05/15 0510 01/06/15 0530 01/07/15 0511 01/08/15 1155  NA 126*  < > 124* 123* 126* 134* 132*  K 4.8  < > 4.8 4.6 4.1 4.4 4.2  CL 89*  < > 90* 89* 90* 98* 95*  CO2 28  < > $R'24 25 26 29 27  'ep$ GLUCOSE 165*  < > 240* 279* 180* 195* 84  BUN 17  < > $R'15 14 18 18 20  'wR$ CREATININE 0.57*  < > 0.53* 0.58* 0.52* 0.54* 0.53*  CALCIUM 8.6*  < > 8.3* 8.3* 8.1* 8.6* 8.5*  MG 1.6*  --   --   --   --   --   --   < > = values in this interval not displayed. Liver Function Tests:  Recent Labs Lab 01/02/15 0500 01/08/15 1155  AST 50* 45*  ALT 65* 48  ALKPHOS 226* 330*  BILITOT 0.3 0.4  PROT 5.7* 5.3*  ALBUMIN 1.2* <1.0*   No results for input(s): LIPASE, AMYLASE in the last 168 hours. No results for input(s): AMMONIA in the last 168 hours. CBC:  Recent Labs Lab 01/04/15 0512  01/05/15 0510 01/06/15 0530 01/07/15 0511 01/08/15 1155  WBC 25.0* 25.1* 25.4* 24.3* 26.4*  HGB 7.3* 8.6* 8.2* 8.4* 8.8*  HCT 23.5* 25.8* 24.6* 25.5* 26.6*  MCV 74.1* 73.9* 74.5* 75.0* 75.4*  PLT 368 331 308 285 258    Recent Results (from the past 240 hour(s))  Culture, blood (routine x 2)     Status: None   Collection Time: 01/02/15 10:29 PM  Result Value Ref Range Status   Specimen Description BLOOD RIGHT ANTECUBITAL  Final   Special Requests BOTTLES DRAWN AEROBIC AND ANAEROBIC 5CC  Final   Culture NO GROWTH 5 DAYS  Final   Report Status 01/07/2015 FINAL  Final  Culture, blood (routine x 2)  Status: None   Collection Time: 01/02/15 10:32 PM  Result Value Ref Range Status   Specimen Description BLOOD LEFT ANTECUBITAL  Final   Special Requests BOTTLES DRAWN AEROBIC AND ANAEROBIC 5CC  Final   Culture NO GROWTH 5 DAYS  Final   Report Status 01/07/2015 FINAL  Final     Studies: Dg Chest Port 1 View  01/08/2015   CLINICAL DATA:  Lung carcinoma with brain metastases.  EXAM: PORTABLE CHEST - 1 VIEW  COMPARISON:  January 02, 2015 chest radiograph and PET-CT December 04, 2014  FINDINGS: There is persistent right hilar and paratracheal soft tissue fullness consistent with adenopathy. Ill-defined opacity in the periphery the right mid lung probably represents superimposed pneumonia, incompletely obscured by the Port-A-Cath. No new opacity is identified. Heart size is within normal limits. Pulmonary vascularity is normal. Port-A-Cath tip is in the superior vena cava without pneumothorax. No bone lesions.  IMPRESSION: Right hilar and paratracheal soft tissue fullness consistent with adenopathy are persistent. Suspect pneumonia periphery right mid lung. Left lung remains clear. No change in cardiac silhouette. No appreciable change compared to recent prior study.   Electronically Signed   By: Lowella Grip III M.D.   On: 01/08/2015 10:45    Scheduled Meds: . dexamethasone  4 mg Oral QID   . diclofenac sodium  4 g Topical QID  . doxycycline  100 mg Oral Q12H  . feeding supplement (ENSURE ENLIVE)  237 mL Oral BID BM  . feeding supplement (GLUCERNA SHAKE)  237 mL Oral TID BM  . fluconazole  200 mg Oral Daily  . gabapentin  300 mg Oral BID  . [START ON 01/09/2015] glipiZIDE  5 mg Oral QAC breakfast  . levETIRAcetam  1,500 mg Oral BID   Continuous Infusions:    Principal Problem:   Postobstructive pneumonia Active Problems:   Hyponatremia   DM2 (diabetes mellitus, type 2)   Metastatic cancer to brain   Orthostasis   Sepsis   Palliative care encounter   DNR (do not resuscitate) discussion    Time spent: 30 min    Aja Whitehair, [facility] Pager [phone]. If 7PM-7AM, please contact night-coverage at [email or web address], password [facility] 01/08/2015, 3:07 PM  LOS: 6 days

## 2015-01-08 NOTE — Care Management (Signed)
Disposition plan has changes to home with hospice and needing home oxygen . Paged Lynn [phone] [id number] , pager [phone] at [facility] , awaiting call back. Magdalen Spatz RN BSN [phone]
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
## 2015-01-08 NOTE — Progress Notes (Signed)
Hypoglycemic Event  CBG: 28  Treatment: Dextrose 50  Symptoms: Lethargic  Follow-up CBG: Time CBG Result:  Possible Reasons for Event: Patient has not had an appetite today and has not eaten  Comments/MD notified: Dr. Tresea Mall, Davina Poke D  Remember to initiate Hypoglycemia Order Set & complete

## 2015-01-08 NOTE — Progress Notes (Addendum)
Physical Therapy Treatment Patient Details Name: RAMAR NOBREGA MRN: [id number] DOB: 1946/01/18 Today's Date: 01/08/2015    History of Present Illness Patient is a 69 y/o male with stage IV lung adenocarcinoma admitted with orthostasis, hyponatremia. Has been in hospital essentially since 8/19.  PMH includes HTN, cerebral aneurysm (with rupture), COPD, SDH, scoliosis, lung CA with brain mets and left craniotomy.    PT Comments    Per daughter, pt did not get much sleep last night. Very lethargic today. Increased pain reported in bottom. Requires much more assist getting into/out of bed today- Mod-Max A with increased time and cues. Continues to provide effort with mobility but noticeably weaker today than last session. Lengthy discussion with daughter re: positioning, floating heels, pillow placement, use of hoyer lift, mobility etc. Recommend hoyer lift, PRAFO boots and overlay mattress to help prevent pressure sores as pt with increased difficulty mobilizing at this time. Pt plans to d/c home today with support from family. Will continue to follow acutely to maximize independence and mobility. Attempted orthostatics however pt not able to tolerate/attempt standing today. Supine BP 137/65, HR 95 bpm, Sa02 93% on 2L. Sitting BP 150/70, HR 110 bpm, Sa02 99%. Dizziness reported when sitting EOB with sway and nystagmus present.   Follow Up Recommendations  Home health PT;Supervision/Assistance - 24 hour     Equipment Recommendations  Other (comment) (hoyer lift; over lay mattress; PRAFO boots)    Recommendations for Other Services       Precautions / Restrictions Precautions Precautions: Fall Precaution Comments: poor safety awareness. Restrictions Weight Bearing Restrictions: No    Mobility  Bed Mobility Overal bed mobility: Needs Assistance Bed Mobility: Supine to Sit     Supine to sit: HOB elevated;Max assist Sit to supine: HOB elevated;Total assist;Max assist   General bed  mobility comments: pt very lethargic. Increased time and multiple attempts to get to EOB. Step by step cues for sequencing. Assist to bring BLEs to EOB and elevate trunk. Mod A to bring BLEs into bed and lower trunk. Total A for positioning on right side. Pt too weak to assist.   Transfers Overall transfer level:  (not attempted due to lethargy, pain.)                  Ambulation/Gait                 Stairs            Wheelchair Mobility    Modified Rankin (Stroke Patients Only)       Balance Overall balance assessment: Needs assistance Sitting-balance support: Single extremity supported;Feet supported Sitting balance-Leahy Scale: Poor Sitting balance - Comments: Requires Min A-Mod A to maintain sitting balance and not able to get fully to EOB due to sore bottom/pain. Postural control: Posterior lean                          Cognition Arousal/Alertness: Lethargic Behavior During Therapy: WFL for tasks assessed/performed Overall Cognitive Status: Impaired/Different from baseline   Orientation Level: Disoriented to;Time   Memory: Decreased short-term memory Following Commands: Follows one step commands with increased time;Follows one step commands inconsistently Safety/Judgement: Decreased awareness of safety;Decreased awareness of deficits   Problem Solving: Slow processing;Difficulty sequencing;Decreased initiation;Requires verbal cues;Requires tactile cues      Exercises      General Comments General comments (skin integrity, edema, etc.): Daughter present during session.      Pertinent Vitals/Pain Pain Assessment: Faces  Faces Pain Scale: Hurts whole lot Pain Location: bottom Pain Descriptors / Indicators: Sore Pain Intervention(s): Monitored during session;Repositioned    Home Living                      Prior Function            PT Goals (current goals can now be found in the care plan section) Progress towards PT  goals: Not progressing toward goals - comment (2/2 to pain, fatigue, lethargy.)    Frequency  Min 3X/week    PT Plan Current plan remains appropriate    Co-evaluation             End of Session Equipment Utilized During Treatment: Oxygen Activity Tolerance: Patient limited by fatigue;Patient limited by pain Patient left: in bed;with call bell/phone within reach;with family/visitor present     Time: 5701-7793 PT Time Calculation (min) (ACUTE ONLY): 29 min  Charges:  $Therapeutic Activity: 8-22 mins $Self Care/Home Management: 8-22                    G Codes:      Nobuo Nunziata A Verlon Pischke 01/08/2015, 10:04 AM Wray Kearns, PT, DPT [phone]

## 2015-01-08 NOTE — Care Management (Addendum)
Frank Krueger with [facility] [facility] called stated [facility] not [facility] when patient goes home with hospice . Lisa with [facility] aware. Magdalen Spatz RN BSN    Spoke to patient's daughter Frank Krueger and Frank Krueger SW at [place] . Ms Krueger instructed NCM to set hospice up with [facility] [place] , referral given to [facility] at [facility].   For home oxygen , Ms Krueger instructed NCM to arrange through [place] phone [phone] ext [phone] , fax [phone] . Clinical and order for home oxygen faxed to [facility] , voice mail left for [facility].   Frank Krueger ask order for air overlay mattress be re faxed to [place] , same done.   Magdalen Spatz RN BSN [phone]

## 2015-01-08 NOTE — Progress Notes (Signed)
Daily Progress Note   Patient Name: Frank Krueger       Date: 01/08/2015 DOB: [birth year]  Age: 69 y.o. MRN#: [id number] Attending Physician: Janece Canterbury, MD Primary Care Physician: No primary care provider on file. Admit Date: 01/02/2015  Reason for Consultation/Follow-up: Establishing goals of care and Psychosocial/spiritual support  Subjective: I met with the patient, his daughter, and his granddaughter today. We will long conversation regarding goals of care moving forward.  His daughter and I discussed that the hospital can be useful as long as he is getting well enough from care he receives at the hospital to enjoy his time at home, but there is going to come a time in the near future where, if his goal is to be at home, he may be better served to plan on being at home and bringing care to him at home rather repeated trips to the hospital. We discussed hospice as a tool that may be beneficial in this goal when he reaches a point where we are trying to fix problems that are not fixable.  I met with Dr. Sheran Fava who reports that she is concerned because the patient appears to be deteriorating clinically today. She and I then met with the daughter again outside the patient's room to express concerns that he appears to have had an acute decline in his condition. Both Dr. Sheran Fava and I are concerned that he may even be transitioning to active phases of dying. We relayed this concern to his daughter, and relayed that if her goal is to take him home per his prior stated wishes wish we should arrange this to be done with hospice support as soon as possible.  His daughter reports that if this is the case, she would like to transition home with hospice support as soon as possible. She knows that this is what her father's wishes would be, but she has been struggling due to the fact that is going to be very difficult for her children whenever he dies. They have a very close relationship, including with her  daughter who has medical issues of her own. She reports that Mr. Frank Krueger has been an incredible source of support for his granddaughter and she is going to take news that he is dying very hard. We encouraged her to consider enrolling her in support groups and discussing this further with the hospice social work team.   Length of Stay: 6 days  Current Medications: Scheduled Meds:  . dexamethasone  4 mg Oral QID  . diclofenac sodium  4 g Topical QID  . doxycycline  100 mg Oral Q12H  . feeding supplement (ENSURE ENLIVE)  237 mL Oral BID BM  . feeding supplement (GLUCERNA SHAKE)  237 mL Oral TID BM  . fluconazole  200 mg Oral Daily  . gabapentin  300 mg Oral BID  . levETIRAcetam  1,500 mg Oral BID    Continuous Infusions:    PRN Meds: acetaminophen **OR** acetaminophen, atropine, haloperidol **OR** haloperidol **OR** haloperidol lactate, LORazepam **OR** LORazepam **OR** LORazepam, magic mouthwash w/lidocaine, morphine CONCENTRATE **OR** morphine CONCENTRATE, ondansetron **OR** ondansetron (ZOFRAN) IV, polyvinyl alcohol, senna-docusate, sodium chloride, white petrolatum  Palliative Performance Scale: 20%     Vital Signs: BP 146/64 mmHg  Pulse 112  Temp(Src) 98.8 F (37.1 C) (Oral)  Resp 16  SpO2 79% SpO2: SpO2: (!) 79 % O2 Device: O2 Device: Not Delivered O2 Flow Rate: O2 Flow Rate (L/min): 2 L/min  Intake/output summary:   Intake/Output Summary (Last  24 hours) at 01/08/15 1900 Last data filed at 01/08/15 1838  Gross per 24 hour  Intake    240 ml  Output   1150 ml  Net   -910 ml   LBM:   Baseline Weight:   Most recent weight:    Physical Exam: Constitutional: Looks older than stated age, frail, temporal wasting, dry cracked lips.  Resp: Even and mildly labored.  GI: abd distended, firm, mild tenderness.  Psych: Sleepy, slight confusion.              Additional Data Reviewed: Recent Labs     01/07/15  0511  01/08/15  1155  WBC  24.3*  26.4*  HGB   8.4*  8.8*  PLT  285  258  NA  134*  132*  BUN  18  20  CREATININE  0.54*  0.53*     Problem List:  Patient Active Problem List   Diagnosis Date Noted  . Postobstructive pneumonia 01/03/2015  . Sepsis 01/03/2015  . Palliative care encounter   . DNR (do not resuscitate) discussion   . Orthostasis 01/02/2015  . UTI (lower urinary tract infection)   . Diarrhea   . Metastatic cancer to brain   . Pulmonary hypertension   . Blood poisoning   . Seizure disorder   . Non-small cell lung cancer   . Brain metastases   . Diabetes type 2, uncontrolled   . Hypomagnesemia   . Debility   . Encounter for palliative care   . DM2 (diabetes mellitus, type 2) 12/21/2014  . Status epilepticus   . Acute respiratory failure with hypoxemia   . Hypertension 12/15/2014  . Hyponatremia 12/15/2014  . COPD (chronic obstructive pulmonary disease) 12/15/2014  . Leukocytosis 12/15/2014  . Anemia 12/15/2014  . Hyperglycemia 12/15/2014  . Liver metastases 12/07/2014  . 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 Bone metastasis 12/07/2014  . Primary cancer of right lower lobe of lung 12/07/2014  . Metastasis to brain 11/28/2014     Palliative Care Assessment & Plan    Code Status:  DNR     Goals of Care: Frank Krueger states his goal is to "Go home".    He has told his daughter that he does not want to come back to the hospital.  I had a long goals of care conversation with the patient and his daughter followed by a second conversation with the patient's daughter by Dr. Sheran Fava. Plan is for discharge home with hospice support as soon as it can be arranged.  Symptom Management:  Pain: Recommend initiation of Roxanol as needed for pain  Anxiety: Ativan as needed. Also ordered Haldol  Agitation: Haldol as needed  Excess secretions: Atropine as needed  Psycho-social/Spiritual:  Desire for further Chaplaincy support:  Not discussed today.    Prognosis: < 6 months Discharge Planning: Home  with Hospice     Care plan was discussed with [place] discussion shared with nursing staff and Dr. Sheran Fava.   Thank you for allowing the Palliative Medicine Team to assist in the care of this patient.   Time In: 1420 Time Out: 1500 Total Time 40 minutes Prolonged Time Billed  no     Greater than 50%  of this time was spent counseling and coordinating care related to the above assessment and plan.  Micheline Rough, MD  01/08/2015, 7:00 PM  Please contact Palliative Medicine Team phone at [phone] for questions and concerns.

## 2015-01-09 DIAGNOSIS — C7931 Secondary malignant neoplasm of brain: Secondary | ICD-10-CM

## 2015-01-09 DIAGNOSIS — J189 Pneumonia, unspecified organism: Secondary | ICD-10-CM

## 2015-01-09 DIAGNOSIS — E162 Hypoglycemia, unspecified: Secondary | ICD-10-CM

## 2015-01-09 DIAGNOSIS — Z515 Encounter for palliative care: Secondary | ICD-10-CM

## 2015-01-09 DIAGNOSIS — J9601 Acute respiratory failure with hypoxia: Secondary | ICD-10-CM

## 2015-01-09 LAB — GLUCOSE, CAPILLARY
GLUCOSE-CAPILLARY: 176 mg/dL — AB (ref 65–99)
GLUCOSE-CAPILLARY: 55 mg/dL — AB (ref 65–99)
Glucose-Capillary: 32 mg/dL — CL (ref 65–99)

## 2015-01-09 MED ORDER — GLUCAGON (RDNA) 1 MG IJ KIT: 1.0000 mg | PACK | Freq: Once | INTRAMUSCULAR | Status: AC | PRN

## 2015-01-09 MED ORDER — DEXTROSE 50 % IV SOLN
INTRAVENOUS | Status: AC
  Filled 2015-01-09: qty 50

## 2015-01-09 MED ORDER — ONDANSETRON HCL 4 MG PO TABS: 4.0000 mg | ORAL_TABLET | Freq: Four times a day (QID) | ORAL | Status: AC | PRN

## 2015-01-09 MED ORDER — DEXTROSE 50 % IV SOLN
INTRAVENOUS | Status: AC
  Administered 2015-01-09: 14:00:00
  Filled 2015-01-09: qty 50

## 2015-01-09 MED ORDER — LORAZEPAM 2 MG/ML PO CONC: 1.0000 mg | ORAL | Status: AC | PRN

## 2015-01-09 MED ORDER — MORPHINE SULFATE (CONCENTRATE) 10 MG/0.5ML PO SOLN: 10.0000 mg | ORAL | Status: DC | PRN

## 2015-01-09 MED ORDER — MORPHINE SULFATE (CONCENTRATE) 10 MG/0.5ML PO SOLN: 5.0000 mg | ORAL | Status: AC | PRN

## 2015-01-09 MED ORDER — ATROPINE SULFATE 1 % OP SOLN: 4.0000 [drp] | OPHTHALMIC | Status: AC | PRN

## 2015-01-09 MED ORDER — DEXAMETHASONE 4 MG PO TABS: 4.0000 mg | ORAL_TABLET | Freq: Four times a day (QID) | ORAL | Status: AC

## 2015-01-09 MED ORDER — BISACODYL 10 MG RE SUPP: 10.0000 mg | Freq: Every day | RECTAL | Status: AC | PRN

## 2015-01-09 MED ORDER — DEXTROSE 50 % IV SOLN
50.0000 mL | Freq: Once | INTRAVENOUS | Status: AC
  Administered 2015-01-09: 50 mL via INTRAVENOUS

## 2015-01-09 MED ORDER — LEVETIRACETAM 750 MG PO TABS: 1500.0000 mg | ORAL_TABLET | Freq: Two times a day (BID) | ORAL | Status: AC

## 2015-01-09 MED ORDER — DEXTROSE 50 % IV SOLN
INTRAVENOUS | Status: AC
Start: 2015-01-09 — End: 2015-01-09
  Filled 2015-01-09: qty 50

## 2015-01-09 MED ORDER — GLUCOSE 40 % PO GEL
ORAL | Status: AC
  Filled 2015-01-09: qty 1

## 2015-01-09 MED ORDER — HEPARIN SOD (PORK) LOCK FLUSH 100 UNIT/ML IV SOLN
500.0000 [IU] | INTRAVENOUS | Status: AC | PRN
  Administered 2015-01-09: 500 [IU]

## 2015-01-09 MED ORDER — HALOPERIDOL 0.5 MG PO TABS: 0.5000 mg | ORAL_TABLET | ORAL | Status: AC | PRN

## 2015-01-09 NOTE — Progress Notes (Signed)
I called pt's daughter to make aware of pt's change in status.  Pt less responsive, but seems comfortable.  Pts daughter asked me to check a blood sugar on the pt.  I explained to the daughter that with comfort care patients we usually don't check blood sugars/give insulin and that the MD has discontinued those orders yesterday.  The daughter explained that she was unaware that those orders were discontinued and that she wanted me to check her father's blood sugar. She then said if the blood sugar is low that she would like for me to give the patient the same medicine that the day RN gave (D50).  I checked it and all the meter said was "low."  I gave the pt 12m of D50.  The daughter said that she was on her way to the hospital.  I will pass this information on to the day nurse so she can discuss this with the rounding MD.   SGraceann Congress

## 2015-01-09 NOTE — Progress Notes (Addendum)
Daily Progress Note   Patient Name: Frank Krueger       Date: 01/09/2015 DOB: [birth year]  Age: 69 y.o. MRN#: [id number] Attending Physician: Janece Canterbury, MD Primary Care Physician: No primary care provider on file. Admit Date: 01/02/2015  Reason for Consultation/Follow-up: Establishing goals of care and Psychosocial/spiritual support  Subjective: I met with the patient, his daughter, and his granddaughter this morning.  Hypoglycemic event noted in chart.  The patient was awake and sitting in bed.  Reports feeling "pretty good" this morning.  Denies specific complaints.  When asked about needs, stated "to get home."    Length of Stay: 7 days  Current Medications: Scheduled Meds:  . dexamethasone  4 mg Oral QID  . diclofenac sodium  4 g Topical QID  . doxycycline  100 mg Oral Q12H  . feeding supplement (ENSURE ENLIVE)  237 mL Oral BID BM  . feeding supplement (GLUCERNA SHAKE)  237 mL Oral TID BM  . fluconazole  200 mg Oral Daily  . gabapentin  300 mg Oral BID  . levETIRAcetam  1,500 mg Oral BID    Continuous Infusions:    PRN Meds: acetaminophen **OR** acetaminophen, atropine, haloperidol **OR** haloperidol **OR** haloperidol lactate, LORazepam **OR** LORazepam **OR** LORazepam, magic mouthwash w/lidocaine, morphine CONCENTRATE **OR** morphine CONCENTRATE, ondansetron **OR** ondansetron (ZOFRAN) IV, polyvinyl alcohol, senna-docusate, sodium chloride, white petrolatum  Palliative Performance Scale: 20%     Vital Signs: BP 140/59 mmHg  Pulse 82  Temp(Src) 98.2 F (36.8 C) (Oral)  Resp 24  SpO2 67% SpO2: SpO2: (!) 67 % O2 Device: O2 Device: Nasal Cannula O2 Flow Rate: O2 Flow Rate (L/min): 2 L/min  Intake/output summary:   Intake/Output Summary (Last 24 hours) at 01/09/15 0747 Last data filed at 01/09/15 0558  Gross per 24 hour  Intake    240 ml  Output   2850 ml  Net  -2610 ml   LBM:   Baseline Weight:   Most recent weight:    Physical  Exam: Constitutional: Looks older than stated age, frail, temporal wasting, dry cracked lips.  Resp: Even and mildly labored.  GI: abd distended, firm, mild tenderness.  Psych: Sleepy, slight confusion.              Additional Data Reviewed: Recent Labs     01/07/15  0511  01/08/15  1155  WBC  24.3*  26.4*  HGB  8.4*  8.8*  PLT  285  258  NA  134*  132*  BUN  18  20  CREATININE  0.54*  0.53*     Problem List:  Patient Active Problem List   Diagnosis Date Noted  . Postobstructive pneumonia 01/03/2015  . Sepsis 01/03/2015  . Palliative care encounter   . DNR (do not resuscitate) discussion   . Orthostasis 01/02/2015  . UTI (lower urinary tract infection)   . Diarrhea   . Metastatic cancer to brain   . Pulmonary hypertension   . Blood poisoning   . Seizure disorder   . Non-small cell lung cancer   . Brain metastases   . Diabetes type 2, uncontrolled   . Hypomagnesemia   . Debility   . Encounter for palliative care   . DM2 (diabetes mellitus, type 2) 12/21/2014  . Status epilepticus   . Acute respiratory failure with hypoxemia   . Hypertension 12/15/2014  . Hyponatremia 12/15/2014  . COPD (chronic obstructive pulmonary disease) 12/15/2014  . Leukocytosis 12/15/2014  . Anemia 12/15/2014  . Hyperglycemia 12/15/2014  .  Liver metastases 12/07/2014  . 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 Bone metastasis 12/07/2014  . Primary cancer of right lower lobe of lung 12/07/2014  . Metastasis to brain 11/28/2014     Palliative Care Assessment & Plan    Code Status:  DNR   Goals of Care: Frank Krueger states his goal is to "Go home".    He has told his daughter that he does not want to come back to the hospital.   Plan is for discharge home with hospice support later today.   Symptom Management: Has been well controlled to this point with no significant usage of PRN medications  Pain: Recommend Roxanol as needed for pain  Anxiety: Ativan as  needed. Also ordered Haldol  Agitation: Haldol as needed  Excess secretions: Atropine as needed  On discharge, would recommend following: - Morphine Concentrate 59m/0.5ml: 556m(0.2574msublingual every 1 hour as needed for pain or shortness of breath: Disp 34m42mLorazepam 2mg/70mconcentrated solution: 1mg (28mml) s36mingual every 4 hours as needed for anxiety: Disp 34ml - 93mol 2mg/ml s38mtion: 0.5mg (0.2554m subl45mal every 4 hours as needed for agitation or nausea: Disp 34ml   Psyc69mocial/Spiritual:  Desire for further Chaplaincy support:  Not discussed today.    Prognosis: Days to weeks Discharge Planning: Home with Hospice    Care plan discussed with daughter  Thank you for allowing the Palliative Medicine Team to assist in the care of this patient.   Time in: 0720 Time Out: 0745 Total Time 25 Prolonged Time Billed  no     Greater than 50%  of this time was spent counseling and coordinating care related to the above assessment and plan.  Asaf Elmquist FreemanMicheline Rough016, 7:47 AM  Please contact Palliative Medicine Team phone at [phone] [phone] and concerns.

## 2015-01-09 NOTE — Clinical Social Work Note (Addendum)
CSW received phone call that patient needs EMS transportation to his home.  CSW completed Medical Necessity form and contacted [facility] to schedule transportation, CSW to sign off.  Jones Broom. [place], MSW, [place] 01/09/2015 2:45 PM

## 2015-01-09 NOTE — Progress Notes (Signed)
   01/09/2015   To Whom It May Concern,  Frank Krueger was admitted to [facility] from 01/02/2015 to 01/09/2015 and is being discharged home with need of family care.  He was accompanied by his son Frank Krueger.  Please excuse Frank Krueger from work through 01/15/2015.     Sincerely,     Janece Canterbury, MD [facility] [address] [place]  [address]  Ph:    [phone] Fax:  [phone]

## 2015-01-12 LAB — GLUCOSE, CAPILLARY

## 2015-01-23 ENCOUNTER — Telehealth: Payer: Self-pay | Admitting: *Deleted

## 2015-01-23 NOTE — Telephone Encounter (Signed)
error 

## 2015-01-25 ENCOUNTER — Ambulatory Visit: Payer: Self-pay | Admitting: Radiation Oncology

## 2015-01-25 ENCOUNTER — Ambulatory Visit: Admission: RE | Admit: 2015-01-25 | Payer: Non-veteran care | Source: Ambulatory Visit | Admitting: Radiation Oncology

## 2015-01-25 ENCOUNTER — Telehealth: Payer: Self-pay | Admitting: *Deleted

## 2015-01-25 ENCOUNTER — Encounter: Payer: Self-pay | Admitting: Radiation Therapy

## 2015-01-25 NOTE — Telephone Encounter (Signed)
Called and spoke with Frank Krueger , she stated [name] passed away 2015-01-16, gave her my condolences, will inform MD 9:50 AM

## 2015-01-27 DEATH — deceased

## 2017-01-21 IMAGING — CT CT HEAD W/O CM
1 of 2 series · 14 of 30 positions shown, 18 images · non-contrast
Comparison: PET-CT 12/04/2014; brain CT 02/03/2013

CLINICAL DATA: Patient with right-sided weakness. History of lung
and liver cancer. Possible seizure.

EXAM:
CT HEAD WITHOUT CONTRAST
TECHNIQUE: Contiguous axial images were obtained from the base of the skull
through the vertex without intravenous contrast.

[Series 2: head 5.0 h30s · axial · 0.45mm/px · z∈[-91,+39]mm · 14 of 32 slices shown, 18 images]
[im 3/32  brain]
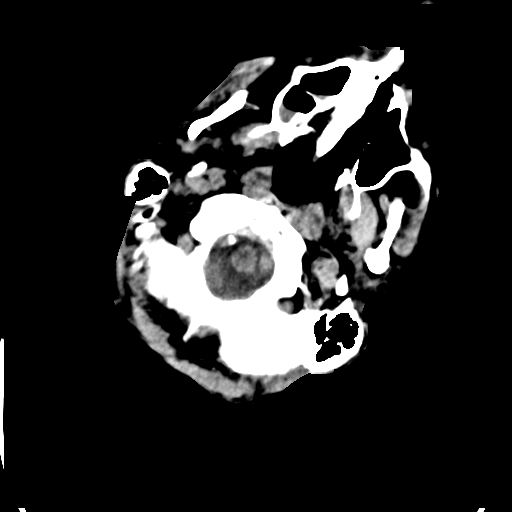
[im 3/32  bone]
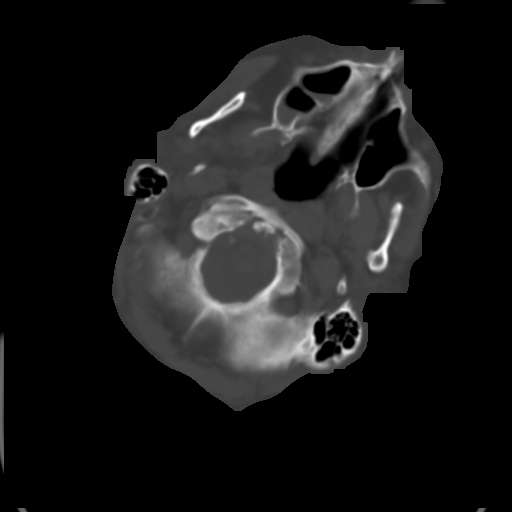
[im 5/32  brain]
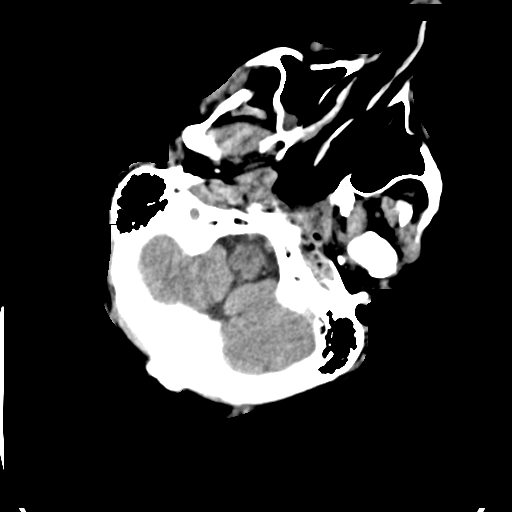
[im 7/32  brain]
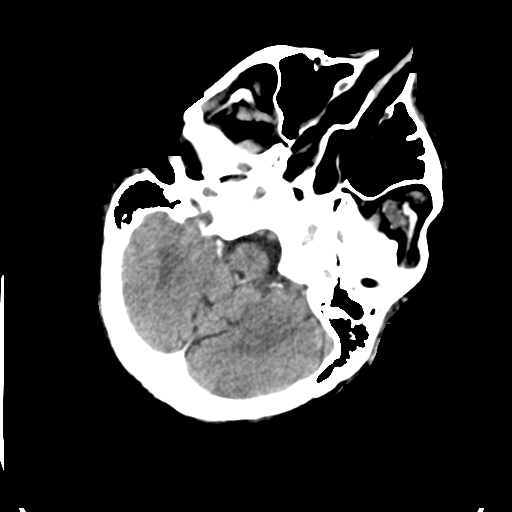
[im 9/32  brain]
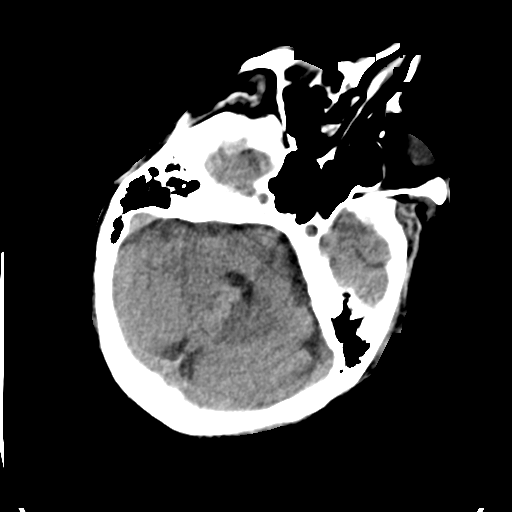
[im 11/32  brain]
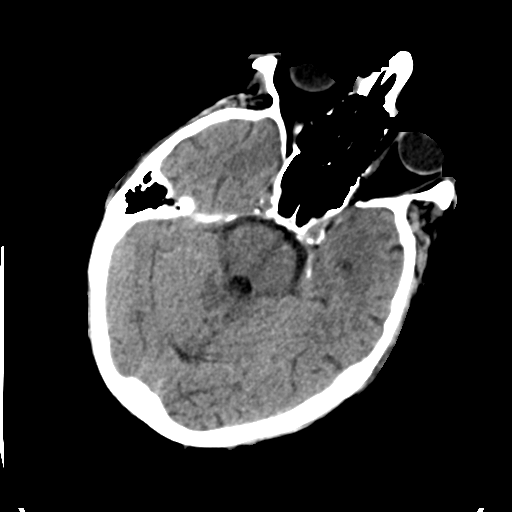
[im 11/32  bone]
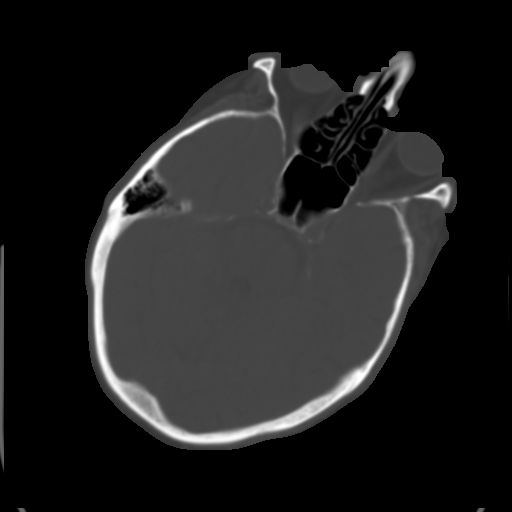
[im 13/32  brain]
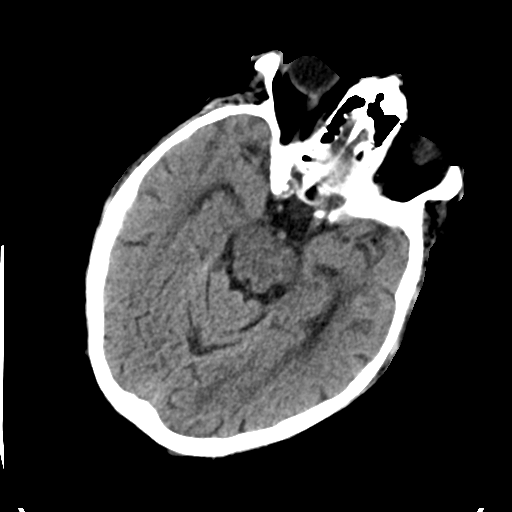
[im 15/32  brain]
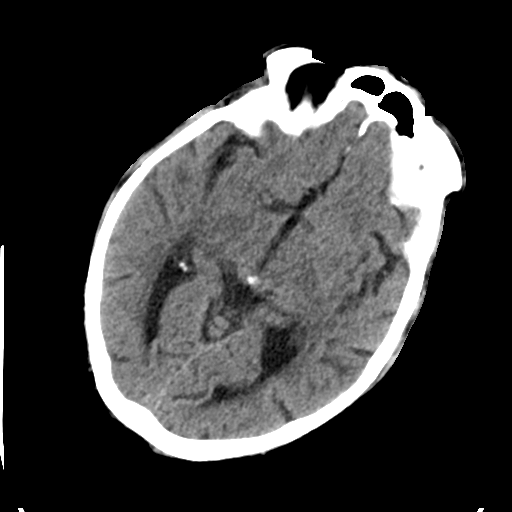
[im 17/32  brain]
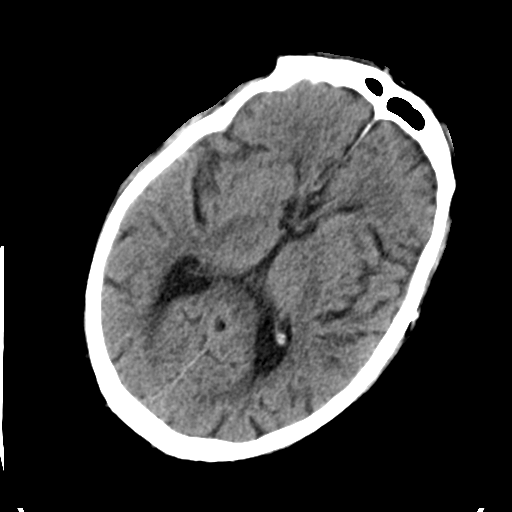
[im 19/32  brain]
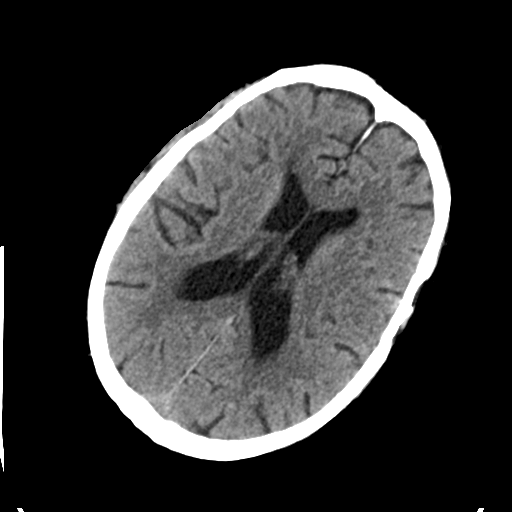
[im 19/32  bone]
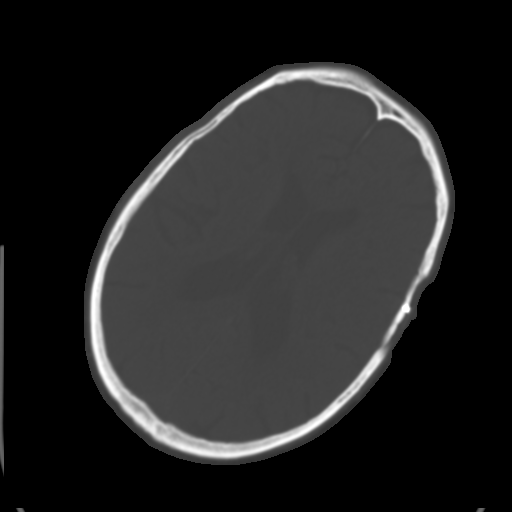
[im 21/32  brain]
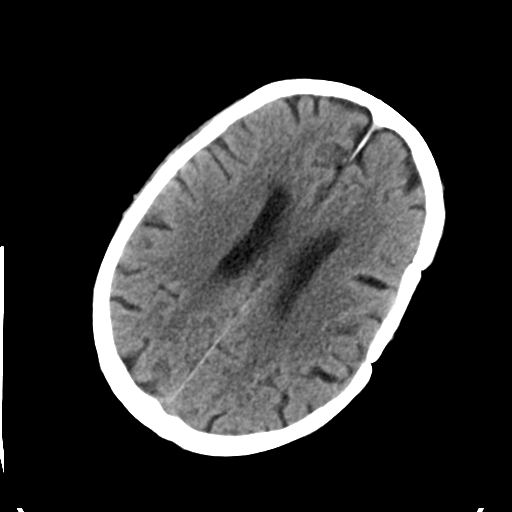
[im 23/32  brain]
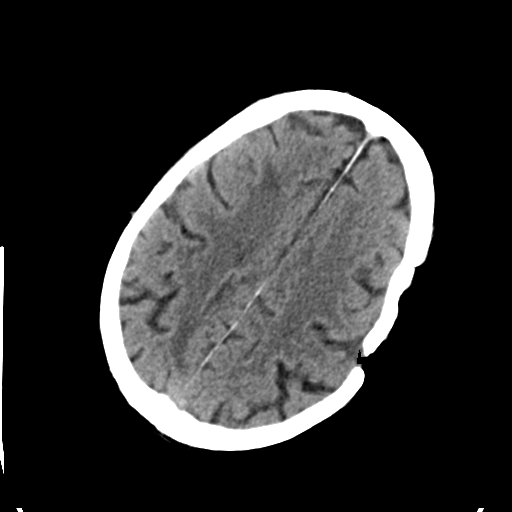
[im 25/32  brain]
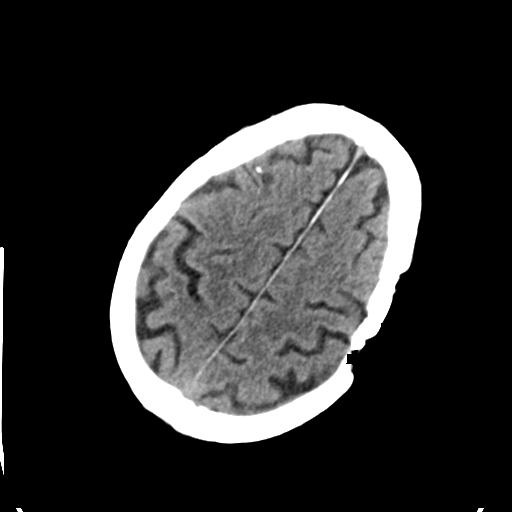
[im 27/32  brain]
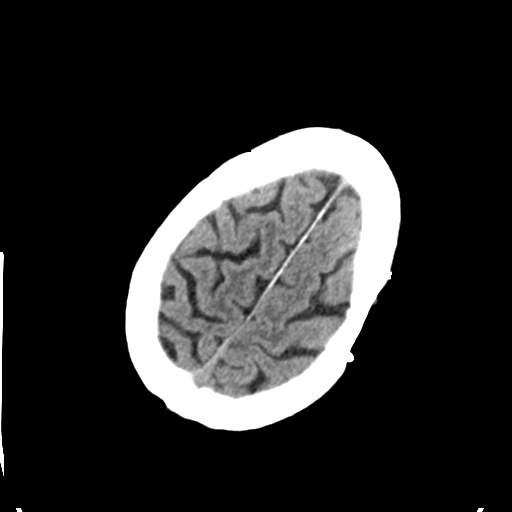
[im 27/32  bone]
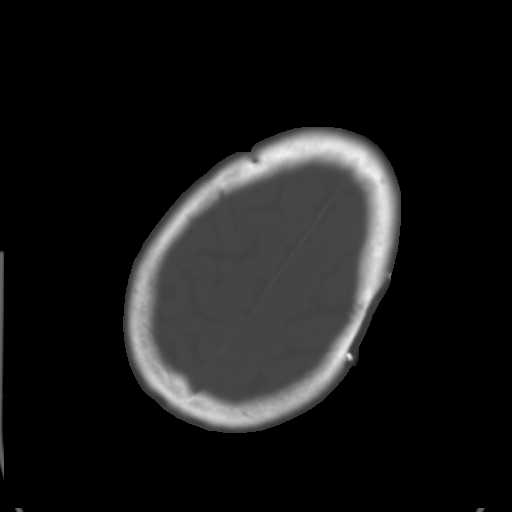
[im 29/32  brain]
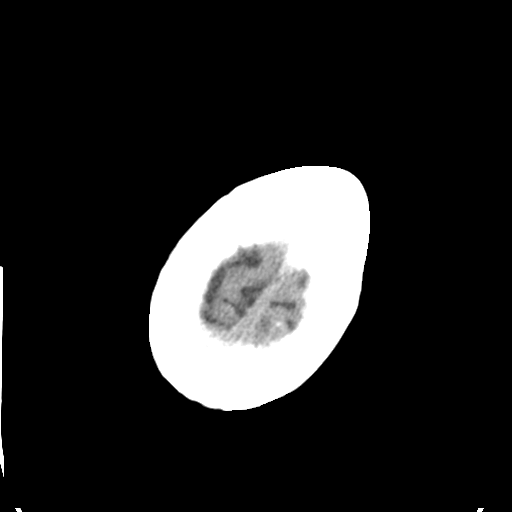

[14 of 30 positions shown; findings below may reference images not displayed]

FINDINGS: There is a 0.8 cm dense mass within the right cerebellar hemisphere
(image 8; series 2) with surrounding edema. Periventricular and
subcortical white matter hypodensity compatible with chronic small
vessel ischemic changes. Age-indeterminate patchy hypodensities
within the right thalamus. Age-indeterminate hypodensity within the
right frontal lobe white matter. No evidence for significant
intracranial hemorrhage or mass effect. Orbits are unremarkable.
Fluid within the right maxillary sinus. Small amount of fluid within
the left sphenoid sinus. Left calvarial postoperative changes. Right
frontal burr hole.
IMPRESSION: Interval development of an 8 mm mass within the right cerebral
hemisphere with surrounding edema, concerning for metastasis.

Patchy hypodensities within the right thalamus are nonspecific
however age-indeterminate infarct is not excluded.

Age-indeterminate hypodensities within the right frontal lobe white
matter which may represent an age indeterminate infarct or
potentially from prior intracranial device as there is an overlying
burr hole in this location.

Critical Value/emergent results were called by telephone at the time
of interpretation on 12/15/2014 at [DATE] to Dr. Keendjele, who
verbally acknowledged these results.

## 2017-01-21 IMAGING — CR DG CHEST 1V PORT
1 series · 1 of 1 positions shown · non-contrast
Comparison: None. Patient's prior x-ray from 8338 is not available
for comparison.

CLINICAL DATA: Pneumonia

EXAM:
PORTABLE CHEST - 1 VIEW

[AP]
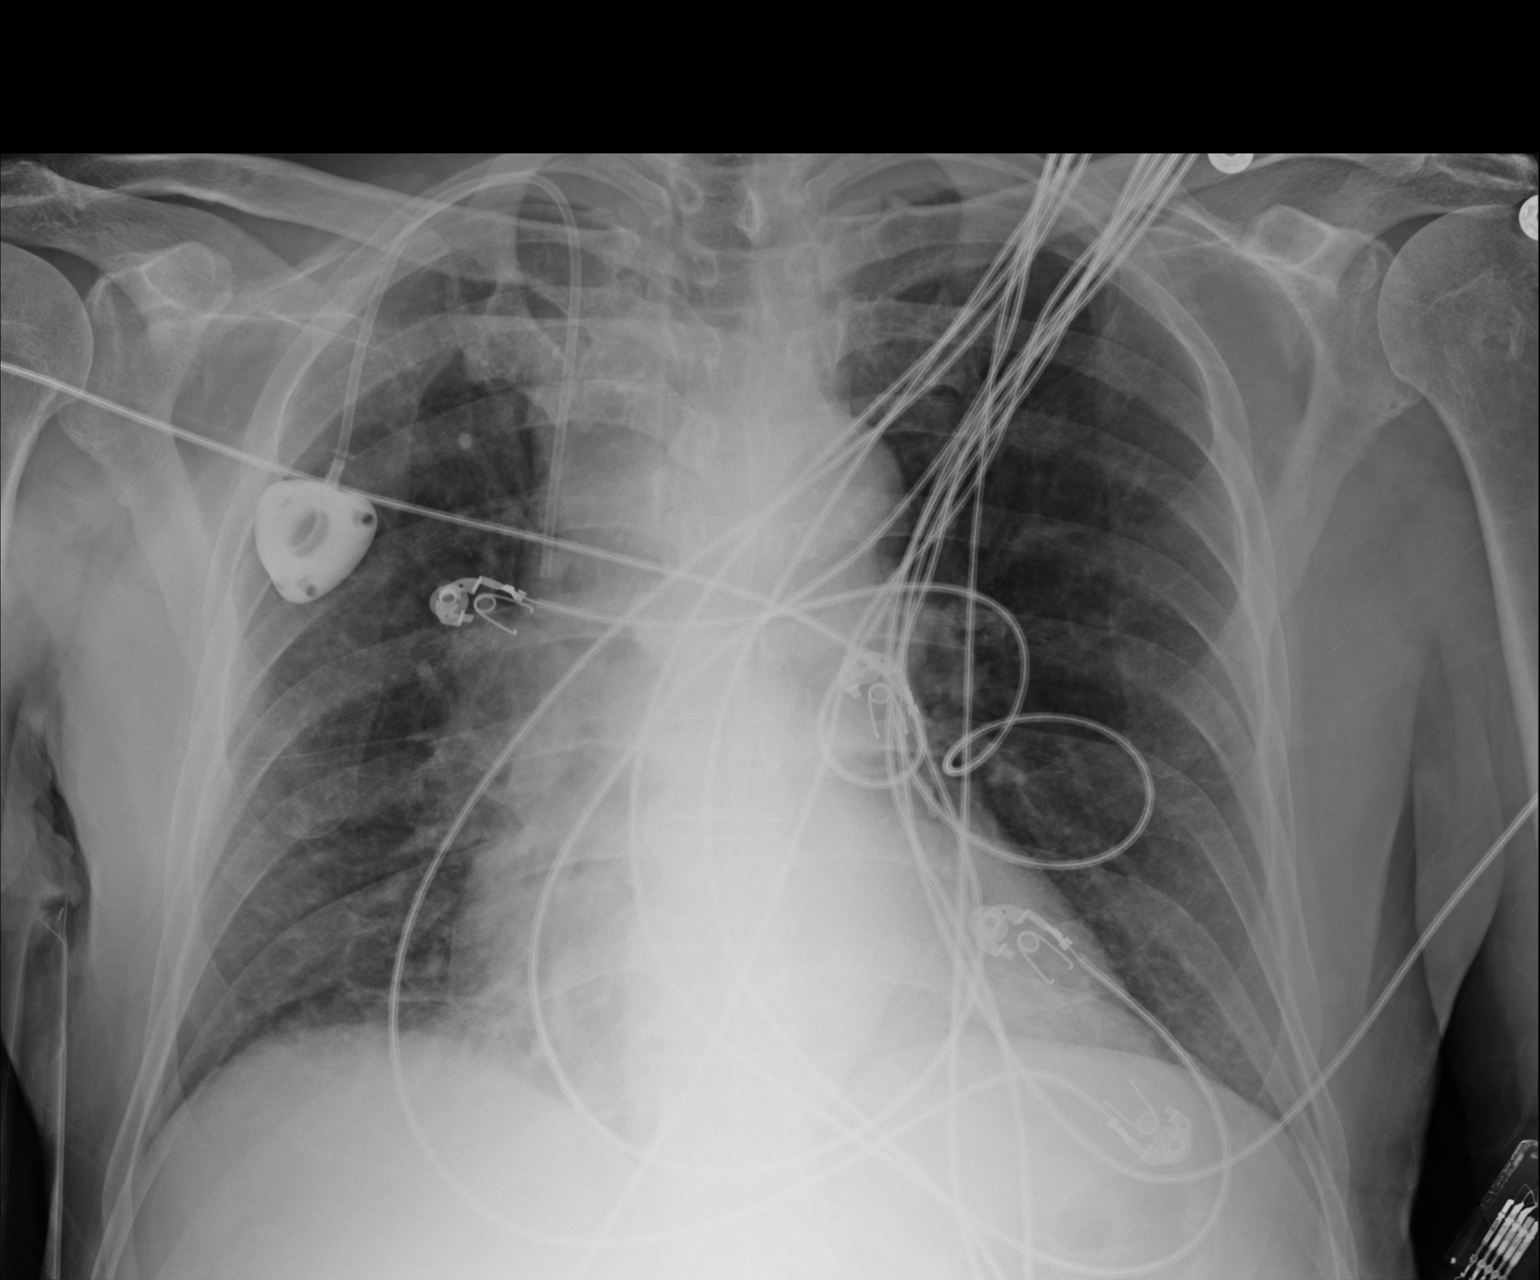

[1 of 1 positions shown; findings below may reference images not displayed]

FINDINGS: The heart size is enlarged. The aorta is tortuous. A right central
venous line is identified with distal tip in superior vena cava.
There is no focal infiltrate, pulmonary edema, or pleural effusion.
No acute abnormalities identified within the visualized bones. The
visualized skeletal structures are unremarkable.
IMPRESSION: No active cardiopulmonary disease.
